# Patient Record
Sex: Female | Born: 1945
Health system: Southern US, Community
[De-identification: ages and names within clinical notes are randomized; demographics above are authoritative.]

---

## 1998-04-14 ENCOUNTER — Other Ambulatory Visit: Admission: RE | Admit: 1998-04-14 | Discharge: 1998-04-14 | Payer: Self-pay | Admitting: *Deleted

## 1999-05-15 ENCOUNTER — Other Ambulatory Visit: Admission: RE | Admit: 1999-05-15 | Discharge: 1999-05-15 | Payer: Self-pay | Admitting: *Deleted

## 1999-11-19 ENCOUNTER — Encounter: Payer: Self-pay | Admitting: Internal Medicine

## 1999-11-19 ENCOUNTER — Encounter: Admission: RE | Admit: 1999-11-19 | Discharge: 1999-11-19 | Payer: Self-pay | Admitting: Internal Medicine

## 2000-06-01 ENCOUNTER — Other Ambulatory Visit: Admission: RE | Admit: 2000-06-01 | Discharge: 2000-06-01 | Payer: Self-pay | Admitting: *Deleted

## 2000-11-22 ENCOUNTER — Encounter: Admission: RE | Admit: 2000-11-22 | Discharge: 2000-11-22 | Payer: Self-pay | Admitting: Internal Medicine

## 2000-11-22 ENCOUNTER — Encounter: Payer: Self-pay | Admitting: Internal Medicine

## 2000-11-25 ENCOUNTER — Encounter: Admission: RE | Admit: 2000-11-25 | Discharge: 2000-11-25 | Payer: Self-pay | Admitting: Internal Medicine

## 2000-11-25 ENCOUNTER — Encounter: Payer: Self-pay | Admitting: Internal Medicine

## 2001-06-05 ENCOUNTER — Other Ambulatory Visit: Admission: RE | Admit: 2001-06-05 | Discharge: 2001-06-05 | Payer: Self-pay | Admitting: *Deleted

## 2001-12-01 ENCOUNTER — Encounter: Payer: Self-pay | Admitting: *Deleted

## 2001-12-01 ENCOUNTER — Encounter: Admission: RE | Admit: 2001-12-01 | Discharge: 2001-12-01 | Payer: Self-pay | Admitting: *Deleted

## 2002-06-11 ENCOUNTER — Other Ambulatory Visit: Admission: RE | Admit: 2002-06-11 | Discharge: 2002-06-11 | Payer: Self-pay | Admitting: Obstetrics and Gynecology

## 2002-07-18 ENCOUNTER — Encounter: Payer: Self-pay | Admitting: Obstetrics and Gynecology

## 2002-07-18 ENCOUNTER — Encounter: Admission: RE | Admit: 2002-07-18 | Discharge: 2002-07-18 | Payer: Self-pay | Admitting: Obstetrics and Gynecology

## 2002-12-04 ENCOUNTER — Encounter: Payer: Self-pay | Admitting: Obstetrics and Gynecology

## 2002-12-04 ENCOUNTER — Encounter: Admission: RE | Admit: 2002-12-04 | Discharge: 2002-12-04 | Payer: Self-pay | Admitting: Obstetrics and Gynecology

## 2003-08-05 ENCOUNTER — Other Ambulatory Visit: Admission: RE | Admit: 2003-08-05 | Discharge: 2003-08-05 | Payer: Self-pay | Admitting: Obstetrics and Gynecology

## 2004-01-30 ENCOUNTER — Encounter: Admission: RE | Admit: 2004-01-30 | Discharge: 2004-01-30 | Payer: Self-pay | Admitting: Obstetrics and Gynecology

## 2004-08-26 ENCOUNTER — Encounter: Admission: RE | Admit: 2004-08-26 | Discharge: 2004-08-26 | Payer: Self-pay | Admitting: Obstetrics and Gynecology

## 2005-02-15 ENCOUNTER — Encounter: Admission: RE | Admit: 2005-02-15 | Discharge: 2005-02-15 | Payer: Self-pay | Admitting: Obstetrics and Gynecology

## 2006-03-10 ENCOUNTER — Encounter: Admission: RE | Admit: 2006-03-10 | Discharge: 2006-03-10 | Payer: Self-pay | Admitting: Obstetrics and Gynecology

## 2007-04-19 ENCOUNTER — Encounter: Admission: RE | Admit: 2007-04-19 | Discharge: 2007-04-19 | Payer: Self-pay | Admitting: Obstetrics and Gynecology

## 2008-04-24 ENCOUNTER — Encounter: Admission: RE | Admit: 2008-04-24 | Discharge: 2008-04-24 | Payer: Self-pay | Admitting: Obstetrics and Gynecology

## 2009-03-25 ENCOUNTER — Ambulatory Visit (HOSPITAL_BASED_OUTPATIENT_CLINIC_OR_DEPARTMENT_OTHER): Admission: RE | Admit: 2009-03-25 | Discharge: 2009-03-25 | Payer: Self-pay | Admitting: Orthopedic Surgery

## 2009-03-25 ENCOUNTER — Encounter (INDEPENDENT_AMBULATORY_CARE_PROVIDER_SITE_OTHER): Payer: Self-pay | Admitting: Orthopedic Surgery

## 2009-04-28 ENCOUNTER — Encounter: Admission: RE | Admit: 2009-04-28 | Discharge: 2009-04-28 | Payer: Self-pay | Admitting: Obstetrics and Gynecology

## 2010-07-09 ENCOUNTER — Encounter: Admission: RE | Admit: 2010-07-09 | Discharge: 2010-07-09 | Payer: Self-pay | Admitting: Obstetrics and Gynecology

## 2011-01-09 ENCOUNTER — Encounter: Payer: Self-pay | Admitting: Internal Medicine

## 2011-02-05 ENCOUNTER — Other Ambulatory Visit: Payer: Self-pay | Admitting: Obstetrics and Gynecology

## 2011-02-05 DIAGNOSIS — M8580 Other specified disorders of bone density and structure, unspecified site: Secondary | ICD-10-CM

## 2011-02-19 ENCOUNTER — Ambulatory Visit
Admission: RE | Admit: 2011-02-19 | Discharge: 2011-02-19 | Disposition: A | Discharge status: Home / Self Care | Payer: Medicare Other | Source: Ambulatory Visit | Attending: Obstetrics and Gynecology | Admitting: Obstetrics and Gynecology

## 2011-02-19 DIAGNOSIS — M8580 Other specified disorders of bone density and structure, unspecified site: Secondary | ICD-10-CM

## 2011-03-31 LAB — POCT HEMOGLOBIN-HEMACUE: Hemoglobin: 14 g/dL (ref 12.0–15.0)

## 2011-05-04 NOTE — Op Note (Signed)
NAME:  Beth Wilcox, Beth Wilcox              ACCOUNT NO.:  [id number]   MEDICAL RECORD NO.:  [id number]          PATIENT TYPE:  AMB   LOCATION:  DSC                          FACILITY:  [facility]   PHYSICIAN:  Katy Fitch. Sypher, M.D. DATE OF BIRTH:  06-Oct-1946   DATE OF PROCEDURE:  03/25/2009  DATE OF DISCHARGE:                               OPERATIVE REPORT   PREOPERATIVE DIAGNOSIS:  Left ulnar neuropathy at wrist level with  clinical examination and MRI documenting a ganglion cyst or tumor  adjacent to the ulnar nerve in the canal of Guyon.   POSTOPERATIVE DIAGNOSIS:  Probable interneural schwannoma.   FINAL DIAGNOSIS:  Pending histopathologic evaluation and biopsy  specimen.   OPERATION:  Decompression of left ulnar nerve at Guyon canal and  resection of an intraneural tumor, presumably schwannoma.   OPERATIONS:  Katy Fitch. Sypher, MD   ASSISTANT:  Marveen Reeks Dasnoit, PA-C   ANESTHESIA:  General by LMA.   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 Kirby Funk  for evaluation and management of weakness and numbness of the left hand.  Clinical examination revealed signs of ulnar nerve dysfunction,  intrinsic atrophy, weak pinch, and discrete loss of sensibility in the  distal ulnar nerve palmar distribution.   Clinical examination revealed a firm area in the hypothenar eminence  suggestive of possible ganglion or other mass.  Beth Wilcox is referred  for an MRI of her hand at [facility] on January 31, 2009.  This was  interpreted by Dr. Vear Clock, revealed probable ganglion in the canal of  Guy[place].  Dr. Vear Clock did not entertain the concept of using contrast  with a second study, however, given the clinical mass predicament we  elected to proceed directly to exploration.   After informed consent, Beth Wilcox is brought to the operating room  at  this time.  Preoperatively, she was advised potential risks and benefits  of surgery.  She understands that we cannot guarantee any particular  degree of nerve recovery after decompression, however, in general once  the nerve was decompressed there is typically recovery of motor and  sensory function over the course of 6 or more months.   Preoperatively, questions were invited and answered in detail.   PROCEDURE:  Beth Wilcox was brought to the operating room and  placed in the supine position upon the operating table.   Following an anesthesia consult with Dr. Katrinka Blazing, general anesthesia by  LMA technique was recommended and accepted.   Beth Wilcox was brought to the operating room and placed in the supine  position upon the operating table and under Dr. Michaelle Copas direct  supervision, general anesthesia by LMA technique was induced.   The left arm was prepped with Betadine soap and solution, sterilely  draped.  A pneumatic tourniquet was applied to the proximal right  brachium.  Following exsanguination of the left arm with Esmarch  bandage, arterial tourniquet was inflated to 230 mmHg.   Procedure commenced with  a longitudinal incision paralleling the ulnar  nerve path through Guyon canal extended proximally with a Brunner zigzag  incision through the wrist flexion creases.  Subcutaneous tissues were  carefully divided revealing the palmaris brevis.  This was released  revealing an obvious intraneural tumor in the ulnar nerve just proximal  to the bifurcation of the motor and sensory branches.   The ulnar nerve and artery were identified proximally and with careful  dissection the epineurium was split.  With the aid of loupe  magnification, we carefully shelled out a highly vascular mass that  would appear to be interfascicular.   With a micro Therapist, nutritional, we were able to very carefully tease the  tumor out of the nerve without disrupting into the axons   This mass  had the characteristics of a schwannoma.   The mass was removed from its pseudocapsule and immediately placed into  formalin for pathologic evaluation.   The nerve was otherwise not manipulated.   The tourniquet was released and bleeding was controlled by direct  pressure.  The wound was then repaired with subcutaneous suture of 4-0  Vicryl and intradermal 3-0 Prolene with Steri-Strips.   A 2% lidocaine was infiltrated for postoperative analgesia.   There were no apparent complications.   Given the intraneural nature of this tumor, we will need to await  pathologic evaluation to determine a prognosis.  The nature of the  lesion will be explained to Mr. And Ms. Gato in the postoperative  conference.      Katy Fitch Sypher, M.D.  Electronically Signed     RVS/MEDQ  D:  03/25/2009  T:  03/25/2009  Job:  [id number]   cc:   Thora Lance, M.D.

## 2011-07-13 ENCOUNTER — Emergency Department (HOSPITAL_COMMUNITY)
Admission: EM | Admit: 2011-07-13 | Discharge: 2011-07-13 | Disposition: A | Discharge status: Home / Self Care | Payer: Medicare Other | Attending: Emergency Medicine | Admitting: Emergency Medicine

## 2011-07-13 DIAGNOSIS — R42 Dizziness and giddiness: Secondary | ICD-10-CM | POA: Insufficient documentation

## 2011-07-13 LAB — CBC
Hemoglobin: 13.4 g/dL (ref 12.0–15.0)
MCHC: 34.1 g/dL (ref 30.0–36.0)
MCV: 91.2 fL (ref 78.0–100.0)

## 2011-07-13 LAB — URINALYSIS, ROUTINE W REFLEX MICROSCOPIC
Hgb urine dipstick: NEGATIVE
Ketones, ur: NEGATIVE mg/dL
Specific Gravity, Urine: 1.025 (ref 1.005–1.030)
pH: 6 (ref 5.0–8.0)

## 2011-07-13 LAB — GLUCOSE, CAPILLARY: Glucose-Capillary: 120 mg/dL — ABNORMAL HIGH (ref 70–99)

## 2011-07-13 LAB — URINE MICROSCOPIC-ADD ON

## 2011-07-13 LAB — DIFFERENTIAL
Basophils Absolute: 0 10*3/uL (ref 0.0–0.1)
Basophils Relative: 0 % (ref 0–1)
Eosinophils Absolute: 0.2 10*3/uL (ref 0.0–0.7)
Eosinophils Relative: 2 % (ref 0–5)
Lymphocytes Relative: 24 % (ref 12–46)
Lymphs Abs: 1.7 10*3/uL (ref 0.7–4.0)
Monocytes Absolute: 0.6 10*3/uL (ref 0.1–1.0)
Monocytes Relative: 9 % (ref 3–12)
Neutro Abs: 4.7 10*3/uL (ref 1.7–7.7)

## 2011-07-13 LAB — POCT I-STAT, CHEM 8: Chloride: 102 mEq/L (ref 96–112)

## 2011-08-24 ENCOUNTER — Other Ambulatory Visit: Payer: Self-pay | Admitting: Obstetrics

## 2011-08-24 DIAGNOSIS — Z1231 Encounter for screening mammogram for malignant neoplasm of breast: Secondary | ICD-10-CM

## 2011-09-02 ENCOUNTER — Ambulatory Visit
Admission: RE | Admit: 2011-09-02 | Discharge: 2011-09-02 | Disposition: A | Discharge status: Home / Self Care | Payer: Medicare Other | Source: Ambulatory Visit | Attending: Obstetrics | Admitting: Obstetrics

## 2011-09-02 DIAGNOSIS — Z1231 Encounter for screening mammogram for malignant neoplasm of breast: Secondary | ICD-10-CM

## 2011-11-25 ENCOUNTER — Encounter (HOSPITAL_COMMUNITY): Admission: RE | Payer: Self-pay | Source: Ambulatory Visit

## 2011-11-25 ENCOUNTER — Ambulatory Visit (HOSPITAL_COMMUNITY): Admission: RE | Admit: 2011-11-25 | Payer: Medicare Other | Source: Ambulatory Visit | Admitting: Gastroenterology

## 2011-11-25 SURGERY — COLONOSCOPY
Anesthesia: Moderate Sedation

## 2012-02-25 DIAGNOSIS — Z136 Encounter for screening for cardiovascular disorders: Secondary | ICD-10-CM | POA: Diagnosis not present

## 2012-02-25 DIAGNOSIS — Z0000 Encounter for general adult medical examination without abnormal findings: Secondary | ICD-10-CM | POA: Diagnosis not present

## 2012-05-29 DIAGNOSIS — Z01419 Encounter for gynecological examination (general) (routine) without abnormal findings: Secondary | ICD-10-CM | POA: Diagnosis not present

## 2012-05-29 DIAGNOSIS — Z124 Encounter for screening for malignant neoplasm of cervix: Secondary | ICD-10-CM | POA: Diagnosis not present

## 2012-08-25 ENCOUNTER — Other Ambulatory Visit: Payer: Self-pay | Admitting: Dermatology

## 2012-08-25 DIAGNOSIS — L570 Actinic keratosis: Secondary | ICD-10-CM | POA: Diagnosis not present

## 2012-08-25 DIAGNOSIS — L819 Disorder of pigmentation, unspecified: Secondary | ICD-10-CM | POA: Diagnosis not present

## 2012-08-25 DIAGNOSIS — D485 Neoplasm of uncertain behavior of skin: Secondary | ICD-10-CM | POA: Diagnosis not present

## 2012-08-25 DIAGNOSIS — D239 Other benign neoplasm of skin, unspecified: Secondary | ICD-10-CM | POA: Diagnosis not present

## 2012-09-12 ENCOUNTER — Other Ambulatory Visit: Payer: Self-pay | Admitting: Obstetrics

## 2012-09-12 DIAGNOSIS — Z1231 Encounter for screening mammogram for malignant neoplasm of breast: Secondary | ICD-10-CM

## 2012-09-25 DIAGNOSIS — Z961 Presence of intraocular lens: Secondary | ICD-10-CM | POA: Diagnosis not present

## 2012-10-05 ENCOUNTER — Ambulatory Visit
Admission: RE | Admit: 2012-10-05 | Discharge: 2012-10-05 | Disposition: A | Discharge status: Home / Self Care | Payer: Medicare Other | Source: Ambulatory Visit | Attending: Obstetrics | Admitting: Obstetrics

## 2012-10-05 DIAGNOSIS — Z1231 Encounter for screening mammogram for malignant neoplasm of breast: Secondary | ICD-10-CM | POA: Diagnosis not present

## 2012-10-09 DIAGNOSIS — Z23 Encounter for immunization: Secondary | ICD-10-CM | POA: Diagnosis not present

## 2012-11-23 DIAGNOSIS — M999 Biomechanical lesion, unspecified: Secondary | ICD-10-CM | POA: Diagnosis not present

## 2012-11-23 DIAGNOSIS — M5430 Sciatica, unspecified side: Secondary | ICD-10-CM | POA: Diagnosis not present

## 2012-11-23 DIAGNOSIS — M5030 Other cervical disc degeneration, unspecified cervical region: Secondary | ICD-10-CM | POA: Diagnosis not present

## 2012-11-23 DIAGNOSIS — IMO0002 Reserved for concepts with insufficient information to code with codable children: Secondary | ICD-10-CM | POA: Diagnosis not present

## 2012-11-23 DIAGNOSIS — M9981 Other biomechanical lesions of cervical region: Secondary | ICD-10-CM | POA: Diagnosis not present

## 2012-11-27 DIAGNOSIS — M5430 Sciatica, unspecified side: Secondary | ICD-10-CM | POA: Diagnosis not present

## 2012-11-27 DIAGNOSIS — M999 Biomechanical lesion, unspecified: Secondary | ICD-10-CM | POA: Diagnosis not present

## 2012-11-27 DIAGNOSIS — M9981 Other biomechanical lesions of cervical region: Secondary | ICD-10-CM | POA: Diagnosis not present

## 2012-11-27 DIAGNOSIS — M5030 Other cervical disc degeneration, unspecified cervical region: Secondary | ICD-10-CM | POA: Diagnosis not present

## 2012-11-27 DIAGNOSIS — IMO0002 Reserved for concepts with insufficient information to code with codable children: Secondary | ICD-10-CM | POA: Diagnosis not present

## 2012-11-29 DIAGNOSIS — M5430 Sciatica, unspecified side: Secondary | ICD-10-CM | POA: Diagnosis not present

## 2012-11-29 DIAGNOSIS — IMO0002 Reserved for concepts with insufficient information to code with codable children: Secondary | ICD-10-CM | POA: Diagnosis not present

## 2012-11-29 DIAGNOSIS — M5030 Other cervical disc degeneration, unspecified cervical region: Secondary | ICD-10-CM | POA: Diagnosis not present

## 2012-11-29 DIAGNOSIS — M999 Biomechanical lesion, unspecified: Secondary | ICD-10-CM | POA: Diagnosis not present

## 2012-11-29 DIAGNOSIS — M9981 Other biomechanical lesions of cervical region: Secondary | ICD-10-CM | POA: Diagnosis not present

## 2012-12-01 DIAGNOSIS — M5030 Other cervical disc degeneration, unspecified cervical region: Secondary | ICD-10-CM | POA: Diagnosis not present

## 2012-12-01 DIAGNOSIS — M999 Biomechanical lesion, unspecified: Secondary | ICD-10-CM | POA: Diagnosis not present

## 2012-12-01 DIAGNOSIS — IMO0002 Reserved for concepts with insufficient information to code with codable children: Secondary | ICD-10-CM | POA: Diagnosis not present

## 2012-12-01 DIAGNOSIS — M5430 Sciatica, unspecified side: Secondary | ICD-10-CM | POA: Diagnosis not present

## 2012-12-01 DIAGNOSIS — M9981 Other biomechanical lesions of cervical region: Secondary | ICD-10-CM | POA: Diagnosis not present

## 2012-12-04 DIAGNOSIS — M999 Biomechanical lesion, unspecified: Secondary | ICD-10-CM | POA: Diagnosis not present

## 2012-12-04 DIAGNOSIS — M9981 Other biomechanical lesions of cervical region: Secondary | ICD-10-CM | POA: Diagnosis not present

## 2012-12-04 DIAGNOSIS — IMO0002 Reserved for concepts with insufficient information to code with codable children: Secondary | ICD-10-CM | POA: Diagnosis not present

## 2012-12-04 DIAGNOSIS — M5430 Sciatica, unspecified side: Secondary | ICD-10-CM | POA: Diagnosis not present

## 2012-12-04 DIAGNOSIS — M5030 Other cervical disc degeneration, unspecified cervical region: Secondary | ICD-10-CM | POA: Diagnosis not present

## 2012-12-06 DIAGNOSIS — M9981 Other biomechanical lesions of cervical region: Secondary | ICD-10-CM | POA: Diagnosis not present

## 2012-12-06 DIAGNOSIS — IMO0002 Reserved for concepts with insufficient information to code with codable children: Secondary | ICD-10-CM | POA: Diagnosis not present

## 2012-12-06 DIAGNOSIS — M999 Biomechanical lesion, unspecified: Secondary | ICD-10-CM | POA: Diagnosis not present

## 2012-12-06 DIAGNOSIS — M5030 Other cervical disc degeneration, unspecified cervical region: Secondary | ICD-10-CM | POA: Diagnosis not present

## 2012-12-06 DIAGNOSIS — M5430 Sciatica, unspecified side: Secondary | ICD-10-CM | POA: Diagnosis not present

## 2012-12-08 DIAGNOSIS — M999 Biomechanical lesion, unspecified: Secondary | ICD-10-CM | POA: Diagnosis not present

## 2012-12-08 DIAGNOSIS — M9981 Other biomechanical lesions of cervical region: Secondary | ICD-10-CM | POA: Diagnosis not present

## 2012-12-08 DIAGNOSIS — IMO0002 Reserved for concepts with insufficient information to code with codable children: Secondary | ICD-10-CM | POA: Diagnosis not present

## 2012-12-08 DIAGNOSIS — M5430 Sciatica, unspecified side: Secondary | ICD-10-CM | POA: Diagnosis not present

## 2012-12-08 DIAGNOSIS — M5030 Other cervical disc degeneration, unspecified cervical region: Secondary | ICD-10-CM | POA: Diagnosis not present

## 2012-12-18 DIAGNOSIS — M5430 Sciatica, unspecified side: Secondary | ICD-10-CM | POA: Diagnosis not present

## 2012-12-18 DIAGNOSIS — M999 Biomechanical lesion, unspecified: Secondary | ICD-10-CM | POA: Diagnosis not present

## 2012-12-18 DIAGNOSIS — M9981 Other biomechanical lesions of cervical region: Secondary | ICD-10-CM | POA: Diagnosis not present

## 2012-12-18 DIAGNOSIS — M5030 Other cervical disc degeneration, unspecified cervical region: Secondary | ICD-10-CM | POA: Diagnosis not present

## 2012-12-18 DIAGNOSIS — IMO0002 Reserved for concepts with insufficient information to code with codable children: Secondary | ICD-10-CM | POA: Diagnosis not present

## 2012-12-21 DIAGNOSIS — IMO0002 Reserved for concepts with insufficient information to code with codable children: Secondary | ICD-10-CM | POA: Diagnosis not present

## 2012-12-21 DIAGNOSIS — M999 Biomechanical lesion, unspecified: Secondary | ICD-10-CM | POA: Diagnosis not present

## 2012-12-21 DIAGNOSIS — M5030 Other cervical disc degeneration, unspecified cervical region: Secondary | ICD-10-CM | POA: Diagnosis not present

## 2012-12-21 DIAGNOSIS — M5430 Sciatica, unspecified side: Secondary | ICD-10-CM | POA: Diagnosis not present

## 2012-12-21 DIAGNOSIS — M9981 Other biomechanical lesions of cervical region: Secondary | ICD-10-CM | POA: Diagnosis not present

## 2012-12-25 DIAGNOSIS — IMO0002 Reserved for concepts with insufficient information to code with codable children: Secondary | ICD-10-CM | POA: Diagnosis not present

## 2012-12-25 DIAGNOSIS — M5030 Other cervical disc degeneration, unspecified cervical region: Secondary | ICD-10-CM | POA: Diagnosis not present

## 2012-12-25 DIAGNOSIS — M5430 Sciatica, unspecified side: Secondary | ICD-10-CM | POA: Diagnosis not present

## 2012-12-25 DIAGNOSIS — M999 Biomechanical lesion, unspecified: Secondary | ICD-10-CM | POA: Diagnosis not present

## 2012-12-25 DIAGNOSIS — M9981 Other biomechanical lesions of cervical region: Secondary | ICD-10-CM | POA: Diagnosis not present

## 2012-12-27 DIAGNOSIS — M999 Biomechanical lesion, unspecified: Secondary | ICD-10-CM | POA: Diagnosis not present

## 2012-12-27 DIAGNOSIS — M9981 Other biomechanical lesions of cervical region: Secondary | ICD-10-CM | POA: Diagnosis not present

## 2012-12-27 DIAGNOSIS — M5030 Other cervical disc degeneration, unspecified cervical region: Secondary | ICD-10-CM | POA: Diagnosis not present

## 2012-12-27 DIAGNOSIS — M5430 Sciatica, unspecified side: Secondary | ICD-10-CM | POA: Diagnosis not present

## 2012-12-27 DIAGNOSIS — IMO0002 Reserved for concepts with insufficient information to code with codable children: Secondary | ICD-10-CM | POA: Diagnosis not present

## 2013-01-02 DIAGNOSIS — N3000 Acute cystitis without hematuria: Secondary | ICD-10-CM | POA: Diagnosis not present

## 2013-01-02 DIAGNOSIS — R300 Dysuria: Secondary | ICD-10-CM | POA: Diagnosis not present

## 2013-01-03 DIAGNOSIS — M9981 Other biomechanical lesions of cervical region: Secondary | ICD-10-CM | POA: Diagnosis not present

## 2013-01-03 DIAGNOSIS — M5430 Sciatica, unspecified side: Secondary | ICD-10-CM | POA: Diagnosis not present

## 2013-01-03 DIAGNOSIS — M999 Biomechanical lesion, unspecified: Secondary | ICD-10-CM | POA: Diagnosis not present

## 2013-01-03 DIAGNOSIS — M5030 Other cervical disc degeneration, unspecified cervical region: Secondary | ICD-10-CM | POA: Diagnosis not present

## 2013-01-03 DIAGNOSIS — IMO0002 Reserved for concepts with insufficient information to code with codable children: Secondary | ICD-10-CM | POA: Diagnosis not present

## 2013-01-19 DIAGNOSIS — M999 Biomechanical lesion, unspecified: Secondary | ICD-10-CM | POA: Diagnosis not present

## 2013-01-19 DIAGNOSIS — IMO0002 Reserved for concepts with insufficient information to code with codable children: Secondary | ICD-10-CM | POA: Diagnosis not present

## 2013-01-19 DIAGNOSIS — M5430 Sciatica, unspecified side: Secondary | ICD-10-CM | POA: Diagnosis not present

## 2013-01-19 DIAGNOSIS — M5030 Other cervical disc degeneration, unspecified cervical region: Secondary | ICD-10-CM | POA: Diagnosis not present

## 2013-01-19 DIAGNOSIS — M9981 Other biomechanical lesions of cervical region: Secondary | ICD-10-CM | POA: Diagnosis not present

## 2013-02-08 DIAGNOSIS — M5430 Sciatica, unspecified side: Secondary | ICD-10-CM | POA: Diagnosis not present

## 2013-02-08 DIAGNOSIS — IMO0002 Reserved for concepts with insufficient information to code with codable children: Secondary | ICD-10-CM | POA: Diagnosis not present

## 2013-02-08 DIAGNOSIS — M5030 Other cervical disc degeneration, unspecified cervical region: Secondary | ICD-10-CM | POA: Diagnosis not present

## 2013-02-08 DIAGNOSIS — M9981 Other biomechanical lesions of cervical region: Secondary | ICD-10-CM | POA: Diagnosis not present

## 2013-02-08 DIAGNOSIS — M999 Biomechanical lesion, unspecified: Secondary | ICD-10-CM | POA: Diagnosis not present

## 2013-02-27 DIAGNOSIS — M25569 Pain in unspecified knee: Secondary | ICD-10-CM | POA: Diagnosis not present

## 2013-02-27 DIAGNOSIS — Z1331 Encounter for screening for depression: Secondary | ICD-10-CM | POA: Diagnosis not present

## 2013-03-01 DIAGNOSIS — M9981 Other biomechanical lesions of cervical region: Secondary | ICD-10-CM | POA: Diagnosis not present

## 2013-03-01 DIAGNOSIS — M999 Biomechanical lesion, unspecified: Secondary | ICD-10-CM | POA: Diagnosis not present

## 2013-03-01 DIAGNOSIS — M5030 Other cervical disc degeneration, unspecified cervical region: Secondary | ICD-10-CM | POA: Diagnosis not present

## 2013-03-01 DIAGNOSIS — IMO0002 Reserved for concepts with insufficient information to code with codable children: Secondary | ICD-10-CM | POA: Diagnosis not present

## 2013-03-01 DIAGNOSIS — M5430 Sciatica, unspecified side: Secondary | ICD-10-CM | POA: Diagnosis not present

## 2013-03-29 DIAGNOSIS — M5430 Sciatica, unspecified side: Secondary | ICD-10-CM | POA: Diagnosis not present

## 2013-03-29 DIAGNOSIS — IMO0002 Reserved for concepts with insufficient information to code with codable children: Secondary | ICD-10-CM | POA: Diagnosis not present

## 2013-03-29 DIAGNOSIS — M9981 Other biomechanical lesions of cervical region: Secondary | ICD-10-CM | POA: Diagnosis not present

## 2013-03-29 DIAGNOSIS — M5030 Other cervical disc degeneration, unspecified cervical region: Secondary | ICD-10-CM | POA: Diagnosis not present

## 2013-03-29 DIAGNOSIS — M999 Biomechanical lesion, unspecified: Secondary | ICD-10-CM | POA: Diagnosis not present

## 2013-04-27 DIAGNOSIS — M9981 Other biomechanical lesions of cervical region: Secondary | ICD-10-CM | POA: Diagnosis not present

## 2013-04-27 DIAGNOSIS — M999 Biomechanical lesion, unspecified: Secondary | ICD-10-CM | POA: Diagnosis not present

## 2013-04-27 DIAGNOSIS — M5430 Sciatica, unspecified side: Secondary | ICD-10-CM | POA: Diagnosis not present

## 2013-04-27 DIAGNOSIS — IMO0002 Reserved for concepts with insufficient information to code with codable children: Secondary | ICD-10-CM | POA: Diagnosis not present

## 2013-04-27 DIAGNOSIS — M5030 Other cervical disc degeneration, unspecified cervical region: Secondary | ICD-10-CM | POA: Diagnosis not present

## 2013-05-25 DIAGNOSIS — IMO0002 Reserved for concepts with insufficient information to code with codable children: Secondary | ICD-10-CM | POA: Diagnosis not present

## 2013-05-25 DIAGNOSIS — M5030 Other cervical disc degeneration, unspecified cervical region: Secondary | ICD-10-CM | POA: Diagnosis not present

## 2013-05-25 DIAGNOSIS — M9981 Other biomechanical lesions of cervical region: Secondary | ICD-10-CM | POA: Diagnosis not present

## 2013-05-25 DIAGNOSIS — M5430 Sciatica, unspecified side: Secondary | ICD-10-CM | POA: Diagnosis not present

## 2013-05-25 DIAGNOSIS — M999 Biomechanical lesion, unspecified: Secondary | ICD-10-CM | POA: Diagnosis not present

## 2013-06-13 DIAGNOSIS — L819 Disorder of pigmentation, unspecified: Secondary | ICD-10-CM | POA: Diagnosis not present

## 2013-06-13 DIAGNOSIS — D1801 Hemangioma of skin and subcutaneous tissue: Secondary | ICD-10-CM | POA: Diagnosis not present

## 2013-06-13 DIAGNOSIS — L570 Actinic keratosis: Secondary | ICD-10-CM | POA: Diagnosis not present

## 2013-06-18 DIAGNOSIS — Z124 Encounter for screening for malignant neoplasm of cervix: Secondary | ICD-10-CM | POA: Diagnosis not present

## 2013-06-21 ENCOUNTER — Other Ambulatory Visit: Payer: Self-pay | Admitting: Obstetrics

## 2013-06-21 DIAGNOSIS — M8580 Other specified disorders of bone density and structure, unspecified site: Secondary | ICD-10-CM

## 2013-07-02 DIAGNOSIS — M999 Biomechanical lesion, unspecified: Secondary | ICD-10-CM | POA: Diagnosis not present

## 2013-07-02 DIAGNOSIS — M5030 Other cervical disc degeneration, unspecified cervical region: Secondary | ICD-10-CM | POA: Diagnosis not present

## 2013-07-02 DIAGNOSIS — M5430 Sciatica, unspecified side: Secondary | ICD-10-CM | POA: Diagnosis not present

## 2013-07-02 DIAGNOSIS — IMO0002 Reserved for concepts with insufficient information to code with codable children: Secondary | ICD-10-CM | POA: Diagnosis not present

## 2013-07-02 DIAGNOSIS — M9981 Other biomechanical lesions of cervical region: Secondary | ICD-10-CM | POA: Diagnosis not present

## 2013-07-03 ENCOUNTER — Other Ambulatory Visit: Payer: Medicare Other

## 2013-07-10 ENCOUNTER — Ambulatory Visit
Admission: RE | Admit: 2013-07-10 | Discharge: 2013-07-10 | Disposition: A | Discharge status: Home / Self Care | Payer: Medicare Other | Source: Ambulatory Visit | Attending: Obstetrics | Admitting: Obstetrics

## 2013-07-10 DIAGNOSIS — M949 Disorder of cartilage, unspecified: Secondary | ICD-10-CM | POA: Diagnosis not present

## 2013-07-10 DIAGNOSIS — M899 Disorder of bone, unspecified: Secondary | ICD-10-CM | POA: Diagnosis not present

## 2013-07-10 DIAGNOSIS — M8580 Other specified disorders of bone density and structure, unspecified site: Secondary | ICD-10-CM

## 2013-08-14 DIAGNOSIS — M899 Disorder of bone, unspecified: Secondary | ICD-10-CM | POA: Diagnosis not present

## 2013-08-16 DIAGNOSIS — M542 Cervicalgia: Secondary | ICD-10-CM | POA: Diagnosis not present

## 2013-08-24 DIAGNOSIS — M542 Cervicalgia: Secondary | ICD-10-CM | POA: Diagnosis not present

## 2013-08-27 DIAGNOSIS — M542 Cervicalgia: Secondary | ICD-10-CM | POA: Diagnosis not present

## 2013-09-07 DIAGNOSIS — M542 Cervicalgia: Secondary | ICD-10-CM | POA: Diagnosis not present

## 2013-09-12 DIAGNOSIS — M542 Cervicalgia: Secondary | ICD-10-CM | POA: Diagnosis not present

## 2013-09-17 DIAGNOSIS — Z23 Encounter for immunization: Secondary | ICD-10-CM | POA: Diagnosis not present

## 2013-09-18 DIAGNOSIS — M542 Cervicalgia: Secondary | ICD-10-CM | POA: Diagnosis not present

## 2013-09-20 DIAGNOSIS — M542 Cervicalgia: Secondary | ICD-10-CM | POA: Diagnosis not present

## 2013-09-21 ENCOUNTER — Other Ambulatory Visit: Payer: Self-pay

## 2013-09-21 DIAGNOSIS — Z1231 Encounter for screening mammogram for malignant neoplasm of breast: Secondary | ICD-10-CM

## 2013-09-26 DIAGNOSIS — M542 Cervicalgia: Secondary | ICD-10-CM | POA: Diagnosis not present

## 2013-10-09 ENCOUNTER — Ambulatory Visit
Admission: RE | Admit: 2013-10-09 | Discharge: 2013-10-09 | Disposition: A | Discharge status: Home / Self Care | Payer: Medicare Other | Source: Ambulatory Visit

## 2013-10-09 DIAGNOSIS — Z1231 Encounter for screening mammogram for malignant neoplasm of breast: Secondary | ICD-10-CM | POA: Diagnosis not present

## 2013-12-06 DIAGNOSIS — Z961 Presence of intraocular lens: Secondary | ICD-10-CM | POA: Diagnosis not present

## 2014-02-13 DIAGNOSIS — M542 Cervicalgia: Secondary | ICD-10-CM | POA: Diagnosis not present

## 2014-03-07 DIAGNOSIS — Z0000 Encounter for general adult medical examination without abnormal findings: Secondary | ICD-10-CM | POA: Diagnosis not present

## 2014-03-07 DIAGNOSIS — Z1331 Encounter for screening for depression: Secondary | ICD-10-CM | POA: Diagnosis not present

## 2014-03-07 DIAGNOSIS — Z136 Encounter for screening for cardiovascular disorders: Secondary | ICD-10-CM | POA: Diagnosis not present

## 2014-03-07 DIAGNOSIS — Z23 Encounter for immunization: Secondary | ICD-10-CM | POA: Diagnosis not present

## 2014-05-30 ENCOUNTER — Other Ambulatory Visit: Payer: Self-pay | Admitting: Dermatology

## 2014-05-30 DIAGNOSIS — L820 Inflamed seborrheic keratosis: Secondary | ICD-10-CM | POA: Diagnosis not present

## 2014-05-30 DIAGNOSIS — L570 Actinic keratosis: Secondary | ICD-10-CM | POA: Diagnosis not present

## 2014-05-30 DIAGNOSIS — D485 Neoplasm of uncertain behavior of skin: Secondary | ICD-10-CM | POA: Diagnosis not present

## 2014-05-30 DIAGNOSIS — L819 Disorder of pigmentation, unspecified: Secondary | ICD-10-CM | POA: Diagnosis not present

## 2014-08-21 DIAGNOSIS — M542 Cervicalgia: Secondary | ICD-10-CM | POA: Diagnosis not present

## 2014-08-28 DIAGNOSIS — M542 Cervicalgia: Secondary | ICD-10-CM | POA: Diagnosis not present

## 2014-08-30 DIAGNOSIS — M542 Cervicalgia: Secondary | ICD-10-CM | POA: Diagnosis not present

## 2014-09-02 DIAGNOSIS — M542 Cervicalgia: Secondary | ICD-10-CM | POA: Diagnosis not present

## 2014-09-09 DIAGNOSIS — M542 Cervicalgia: Secondary | ICD-10-CM | POA: Diagnosis not present

## 2014-09-10 ENCOUNTER — Other Ambulatory Visit: Payer: Self-pay

## 2014-09-10 DIAGNOSIS — Z1231 Encounter for screening mammogram for malignant neoplasm of breast: Secondary | ICD-10-CM

## 2014-09-12 DIAGNOSIS — M542 Cervicalgia: Secondary | ICD-10-CM | POA: Diagnosis not present

## 2014-09-17 DIAGNOSIS — M542 Cervicalgia: Secondary | ICD-10-CM | POA: Diagnosis not present

## 2014-09-19 DIAGNOSIS — Z23 Encounter for immunization: Secondary | ICD-10-CM | POA: Diagnosis not present

## 2014-10-17 ENCOUNTER — Ambulatory Visit
Admission: RE | Admit: 2014-10-17 | Discharge: 2014-10-17 | Disposition: A | Discharge status: Home / Self Care | Payer: Medicare Other | Source: Ambulatory Visit

## 2014-10-17 DIAGNOSIS — Z1231 Encounter for screening mammogram for malignant neoplasm of breast: Secondary | ICD-10-CM | POA: Diagnosis not present

## 2014-12-18 DIAGNOSIS — Z961 Presence of intraocular lens: Secondary | ICD-10-CM | POA: Diagnosis not present

## 2015-03-24 DIAGNOSIS — Z6831 Body mass index (BMI) 31.0-31.9, adult: Secondary | ICD-10-CM | POA: Diagnosis not present

## 2015-03-24 DIAGNOSIS — E6609 Other obesity due to excess calories: Secondary | ICD-10-CM | POA: Diagnosis not present

## 2015-03-24 DIAGNOSIS — Z1389 Encounter for screening for other disorder: Secondary | ICD-10-CM | POA: Diagnosis not present

## 2015-03-24 DIAGNOSIS — Z0000 Encounter for general adult medical examination without abnormal findings: Secondary | ICD-10-CM | POA: Diagnosis not present

## 2015-03-24 DIAGNOSIS — Z136 Encounter for screening for cardiovascular disorders: Secondary | ICD-10-CM | POA: Diagnosis not present

## 2015-03-27 DIAGNOSIS — L814 Other melanin hyperpigmentation: Secondary | ICD-10-CM | POA: Diagnosis not present

## 2015-03-27 DIAGNOSIS — D2261 Melanocytic nevi of right upper limb, including shoulder: Secondary | ICD-10-CM | POA: Diagnosis not present

## 2015-03-27 DIAGNOSIS — L821 Other seborrheic keratosis: Secondary | ICD-10-CM | POA: Diagnosis not present

## 2015-03-27 DIAGNOSIS — D1801 Hemangioma of skin and subcutaneous tissue: Secondary | ICD-10-CM | POA: Diagnosis not present

## 2015-03-29 DIAGNOSIS — H6981 Other specified disorders of Eustachian tube, right ear: Secondary | ICD-10-CM | POA: Diagnosis not present

## 2015-03-29 DIAGNOSIS — H9201 Otalgia, right ear: Secondary | ICD-10-CM | POA: Diagnosis not present

## 2015-03-29 DIAGNOSIS — J069 Acute upper respiratory infection, unspecified: Secondary | ICD-10-CM | POA: Diagnosis not present

## 2015-04-28 DIAGNOSIS — H6983 Other specified disorders of Eustachian tube, bilateral: Secondary | ICD-10-CM | POA: Diagnosis not present

## 2015-04-28 DIAGNOSIS — H9193 Unspecified hearing loss, bilateral: Secondary | ICD-10-CM | POA: Diagnosis not present

## 2015-04-28 DIAGNOSIS — J309 Allergic rhinitis, unspecified: Secondary | ICD-10-CM | POA: Diagnosis not present

## 2015-08-04 DIAGNOSIS — L259 Unspecified contact dermatitis, unspecified cause: Secondary | ICD-10-CM | POA: Diagnosis not present

## 2015-09-22 DIAGNOSIS — J309 Allergic rhinitis, unspecified: Secondary | ICD-10-CM | POA: Diagnosis not present

## 2015-09-22 DIAGNOSIS — J300 Vasomotor rhinitis: Secondary | ICD-10-CM | POA: Diagnosis not present

## 2015-09-22 DIAGNOSIS — H1045 Other chronic allergic conjunctivitis: Secondary | ICD-10-CM | POA: Diagnosis not present

## 2015-09-23 DIAGNOSIS — Z23 Encounter for immunization: Secondary | ICD-10-CM | POA: Diagnosis not present

## 2015-10-10 ENCOUNTER — Other Ambulatory Visit: Payer: Self-pay

## 2015-10-10 DIAGNOSIS — Z1231 Encounter for screening mammogram for malignant neoplasm of breast: Secondary | ICD-10-CM

## 2015-10-22 ENCOUNTER — Ambulatory Visit
Admission: RE | Admit: 2015-10-22 | Discharge: 2015-10-22 | Disposition: A | Discharge status: Home / Self Care | Payer: Medicare Other | Source: Ambulatory Visit

## 2015-10-22 DIAGNOSIS — Z1231 Encounter for screening mammogram for malignant neoplasm of breast: Secondary | ICD-10-CM

## 2016-01-15 DIAGNOSIS — Z961 Presence of intraocular lens: Secondary | ICD-10-CM | POA: Diagnosis not present

## 2016-03-22 DIAGNOSIS — H1045 Other chronic allergic conjunctivitis: Secondary | ICD-10-CM | POA: Diagnosis not present

## 2016-03-22 DIAGNOSIS — J300 Vasomotor rhinitis: Secondary | ICD-10-CM | POA: Diagnosis not present

## 2016-03-23 DIAGNOSIS — M19049 Primary osteoarthritis, unspecified hand: Secondary | ICD-10-CM | POA: Diagnosis not present

## 2016-03-23 DIAGNOSIS — Z1389 Encounter for screening for other disorder: Secondary | ICD-10-CM | POA: Diagnosis not present

## 2016-03-23 DIAGNOSIS — E7800 Pure hypercholesterolemia, unspecified: Secondary | ICD-10-CM | POA: Diagnosis not present

## 2016-03-23 DIAGNOSIS — H9193 Unspecified hearing loss, bilateral: Secondary | ICD-10-CM | POA: Diagnosis not present

## 2016-03-23 DIAGNOSIS — J309 Allergic rhinitis, unspecified: Secondary | ICD-10-CM | POA: Diagnosis not present

## 2016-03-23 DIAGNOSIS — J300 Vasomotor rhinitis: Secondary | ICD-10-CM | POA: Diagnosis not present

## 2016-04-13 DIAGNOSIS — H9313 Tinnitus, bilateral: Secondary | ICD-10-CM | POA: Diagnosis not present

## 2016-04-13 DIAGNOSIS — H903 Sensorineural hearing loss, bilateral: Secondary | ICD-10-CM | POA: Diagnosis not present

## 2016-06-02 DIAGNOSIS — L570 Actinic keratosis: Secondary | ICD-10-CM | POA: Diagnosis not present

## 2016-06-02 DIAGNOSIS — L821 Other seborrheic keratosis: Secondary | ICD-10-CM | POA: Diagnosis not present

## 2016-06-02 DIAGNOSIS — L814 Other melanin hyperpigmentation: Secondary | ICD-10-CM | POA: Diagnosis not present

## 2016-06-02 DIAGNOSIS — D1801 Hemangioma of skin and subcutaneous tissue: Secondary | ICD-10-CM | POA: Diagnosis not present

## 2016-09-20 ENCOUNTER — Other Ambulatory Visit: Payer: Self-pay | Admitting: Internal Medicine

## 2016-09-20 DIAGNOSIS — Z1231 Encounter for screening mammogram for malignant neoplasm of breast: Secondary | ICD-10-CM

## 2016-09-24 DIAGNOSIS — Z23 Encounter for immunization: Secondary | ICD-10-CM | POA: Diagnosis not present

## 2016-10-25 ENCOUNTER — Ambulatory Visit
Admission: RE | Admit: 2016-10-25 | Discharge: 2016-10-25 | Disposition: A | Discharge status: Home / Self Care | Payer: Medicare Other | Source: Ambulatory Visit | Attending: Internal Medicine | Admitting: Internal Medicine

## 2016-10-25 DIAGNOSIS — Z1231 Encounter for screening mammogram for malignant neoplasm of breast: Secondary | ICD-10-CM | POA: Diagnosis not present

## 2017-03-25 DIAGNOSIS — Z0000 Encounter for general adult medical examination without abnormal findings: Secondary | ICD-10-CM | POA: Diagnosis not present

## 2017-03-25 DIAGNOSIS — E7800 Pure hypercholesterolemia, unspecified: Secondary | ICD-10-CM | POA: Diagnosis not present

## 2017-03-25 DIAGNOSIS — Z1389 Encounter for screening for other disorder: Secondary | ICD-10-CM | POA: Diagnosis not present

## 2017-03-25 DIAGNOSIS — K219 Gastro-esophageal reflux disease without esophagitis: Secondary | ICD-10-CM | POA: Diagnosis not present

## 2017-03-29 DIAGNOSIS — Z124 Encounter for screening for malignant neoplasm of cervix: Secondary | ICD-10-CM | POA: Diagnosis not present

## 2017-03-29 DIAGNOSIS — N644 Mastodynia: Secondary | ICD-10-CM | POA: Diagnosis not present

## 2017-03-29 DIAGNOSIS — Z01419 Encounter for gynecological examination (general) (routine) without abnormal findings: Secondary | ICD-10-CM | POA: Diagnosis not present

## 2017-07-04 DIAGNOSIS — M79672 Pain in left foot: Secondary | ICD-10-CM | POA: Diagnosis not present

## 2017-07-19 DIAGNOSIS — H524 Presbyopia: Secondary | ICD-10-CM | POA: Diagnosis not present

## 2017-07-19 DIAGNOSIS — H5203 Hypermetropia, bilateral: Secondary | ICD-10-CM | POA: Diagnosis not present

## 2017-07-19 DIAGNOSIS — H52203 Unspecified astigmatism, bilateral: Secondary | ICD-10-CM | POA: Diagnosis not present

## 2017-07-19 DIAGNOSIS — Z961 Presence of intraocular lens: Secondary | ICD-10-CM | POA: Diagnosis not present

## 2017-08-24 DIAGNOSIS — D1801 Hemangioma of skin and subcutaneous tissue: Secondary | ICD-10-CM | POA: Diagnosis not present

## 2017-08-24 DIAGNOSIS — L570 Actinic keratosis: Secondary | ICD-10-CM | POA: Diagnosis not present

## 2017-08-24 DIAGNOSIS — L821 Other seborrheic keratosis: Secondary | ICD-10-CM | POA: Diagnosis not present

## 2017-08-24 DIAGNOSIS — L814 Other melanin hyperpigmentation: Secondary | ICD-10-CM | POA: Diagnosis not present

## 2017-08-24 DIAGNOSIS — L720 Epidermal cyst: Secondary | ICD-10-CM | POA: Diagnosis not present

## 2017-08-24 DIAGNOSIS — D485 Neoplasm of uncertain behavior of skin: Secondary | ICD-10-CM | POA: Diagnosis not present

## 2017-08-24 DIAGNOSIS — L738 Other specified follicular disorders: Secondary | ICD-10-CM | POA: Diagnosis not present

## 2017-10-07 DIAGNOSIS — Z23 Encounter for immunization: Secondary | ICD-10-CM | POA: Diagnosis not present

## 2017-12-21 ENCOUNTER — Other Ambulatory Visit: Payer: Self-pay | Admitting: Internal Medicine

## 2017-12-21 DIAGNOSIS — Z1231 Encounter for screening mammogram for malignant neoplasm of breast: Secondary | ICD-10-CM

## 2018-01-10 ENCOUNTER — Ambulatory Visit: Payer: Medicare Other

## 2018-01-13 ENCOUNTER — Ambulatory Visit
Admission: RE | Admit: 2018-01-13 | Discharge: 2018-01-13 | Disposition: A | Discharge status: Home / Self Care | Payer: Medicare Other | Source: Ambulatory Visit | Attending: Internal Medicine | Admitting: Internal Medicine

## 2018-01-13 DIAGNOSIS — Z1231 Encounter for screening mammogram for malignant neoplasm of breast: Secondary | ICD-10-CM

## 2018-03-14 DIAGNOSIS — S92591A Other fracture of right lesser toe(s), initial encounter for closed fracture: Secondary | ICD-10-CM | POA: Diagnosis not present

## 2018-03-14 DIAGNOSIS — S90121A Contusion of right lesser toe(s) without damage to nail, initial encounter: Secondary | ICD-10-CM | POA: Diagnosis not present

## 2018-04-04 ENCOUNTER — Emergency Department (HOSPITAL_COMMUNITY)
Admission: EM | Admit: 2018-04-04 | Discharge: 2018-04-04 | Disposition: A | Discharge status: Home / Self Care | Payer: Medicare Other | Attending: Emergency Medicine | Admitting: Emergency Medicine

## 2018-04-04 ENCOUNTER — Other Ambulatory Visit: Payer: Self-pay

## 2018-04-04 ENCOUNTER — Emergency Department (HOSPITAL_COMMUNITY): Payer: Medicare Other

## 2018-04-04 ENCOUNTER — Encounter (HOSPITAL_COMMUNITY): Payer: Self-pay | Admitting: Emergency Medicine

## 2018-04-04 DIAGNOSIS — R1084 Generalized abdominal pain: Secondary | ICD-10-CM | POA: Insufficient documentation

## 2018-04-04 DIAGNOSIS — K529 Noninfective gastroenteritis and colitis, unspecified: Secondary | ICD-10-CM | POA: Insufficient documentation

## 2018-04-04 DIAGNOSIS — K921 Melena: Secondary | ICD-10-CM | POA: Diagnosis present

## 2018-04-04 DIAGNOSIS — K5730 Diverticulosis of large intestine without perforation or abscess without bleeding: Secondary | ICD-10-CM | POA: Diagnosis not present

## 2018-04-04 DIAGNOSIS — R109 Unspecified abdominal pain: Secondary | ICD-10-CM | POA: Diagnosis not present

## 2018-04-04 DIAGNOSIS — Z79899 Other long term (current) drug therapy: Secondary | ICD-10-CM | POA: Insufficient documentation

## 2018-04-04 LAB — COMPREHENSIVE METABOLIC PANEL
ALT: 26 U/L (ref 14–54)
ANION GAP: 13 (ref 5–15)
AST: 23 U/L (ref 15–41)
Albumin: 4.3 g/dL (ref 3.5–5.0)
Alkaline Phosphatase: 79 U/L (ref 38–126)
BUN: 22 mg/dL — ABNORMAL HIGH (ref 6–20)
CO2: 24 mmol/L (ref 22–32)
CREATININE: 0.81 mg/dL (ref 0.44–1.00)
Calcium: 9.3 mg/dL (ref 8.9–10.3)
Chloride: 102 mmol/L (ref 101–111)
GFR calc Af Amer: >60 mL/min (ref >60)
Glucose, Bld: 137 mg/dL — ABNORMAL HIGH (ref 65–99)
Potassium: 3.7 mmol/L (ref 3.5–5.1)
SODIUM: 139 mmol/L (ref 135–145)
TOTAL PROTEIN: 7.1 g/dL (ref 6.5–8.1)
Total Bilirubin: 0.3 mg/dL (ref 0.3–1.2)

## 2018-04-04 LAB — CBC
HCT: 43.8 % (ref 36.0–46.0)
HEMATOCRIT: 43.2 % (ref 36.0–46.0)
Hemoglobin: 14.5 g/dL (ref 12.0–15.0)
Hemoglobin: 14.1 g/dL (ref 12.0–15.0)
MCH: 30.6 pg (ref 26.0–34.0)
MCH: 30.1 pg (ref 26.0–34.0)
MCHC: 33.1 g/dL (ref 30.0–36.0)
MCHC: 32.6 g/dL (ref 30.0–36.0)
MCV: 92.4 fL (ref 78.0–100.0)
MCV: 92.1 fL (ref 78.0–100.0)
PLATELETS: 372 10*3/uL (ref 150–400)
Platelets: 367 10*3/uL (ref 150–400)
RBC: 4.74 MIL/uL (ref 3.87–5.11)
RBC: 4.69 MIL/uL (ref 3.87–5.11)
RDW: 12.4 % (ref 11.5–15.5)
RDW: 12.4 % (ref 11.5–15.5)
WBC: 14.6 10*3/uL — ABNORMAL HIGH (ref 4.0–10.5)
WBC: 14.6 10*3/uL — ABNORMAL HIGH (ref 4.0–10.5)

## 2018-04-04 LAB — POC OCCULT BLOOD, ED: Fecal Occult Bld: NEGATIVE

## 2018-04-04 LAB — TYPE AND SCREEN
ABO/RH(D): O NEG
ANTIBODY SCREEN: NEGATIVE

## 2018-04-04 LAB — ABO/RH: ABO/RH(D): O NEG

## 2018-04-04 MED ORDER — IOPAMIDOL (ISOVUE-300) INJECTION 61%
30.0000 mL | Freq: Once | INTRAVENOUS | Status: DC | PRN
  Administered 2018-04-04: 30 mL via ORAL
  Filled 2018-04-04: qty 30

## 2018-04-04 MED ORDER — DICYCLOMINE HCL 10 MG PO CAPS
10.0000 mg | ORAL_CAPSULE | Freq: Once | ORAL | Status: AC
  Administered 2018-04-04: 10 mg via ORAL
  Filled 2018-04-04: qty 1

## 2018-04-04 MED ORDER — IOPAMIDOL (ISOVUE-300) INJECTION 61%
INTRAVENOUS | Status: AC
  Administered 2018-04-04: 30 mL via ORAL
  Filled 2018-04-04: qty 30

## 2018-04-04 MED ORDER — IOPAMIDOL (ISOVUE-300) INJECTION 61%
INTRAVENOUS | Status: AC
  Filled 2018-04-04: qty 100

## 2018-04-04 MED ORDER — IOPAMIDOL (ISOVUE-300) INJECTION 61%
100.0000 mL | Freq: Once | INTRAVENOUS | Status: AC | PRN
  Administered 2018-04-04: 100 mL via INTRAVENOUS

## 2018-04-04 MED ORDER — ALOSETRON HCL 1 MG PO TABS: 1.0000 mg | ORAL_TABLET | Freq: Two times a day (BID) | ORAL | 0 refills | Status: DC

## 2018-04-04 MED ORDER — SODIUM CHLORIDE 0.9 % IV BOLUS
1000.0000 mL | Freq: Once | INTRAVENOUS | Status: AC
  Administered 2018-04-04: 1000 mL via INTRAVENOUS

## 2018-04-04 MED ORDER — ALOSETRON HCL 1 MG PO TABS: 1.0000 mg | ORAL_TABLET | Freq: Two times a day (BID) | ORAL | 0 refills | Status: AC

## 2018-04-04 NOTE — ED Provider Notes (Addendum)
[place] DEPT Provider Note   CSN: [id number] Arrival date & time: 04/04/18  0601     History   Chief Complaint No chief complaint on file.   HPI Beth Wilcox is a 72 y.o. female.  HPI Patient is a 72 year old female presents to the emergency department with complaints of blood mixed with her stool which began in the middle the night last night.  She reports some crampy abdominal pain.  She thinks she may have had a low-grade fever this morning as she had some sweats.  She had chills through the night.  She is never had GI bleeding before.  She reports on her colonoscopy before in the past she did have diverticulum's.  She does not remember when her last colonoscopy was.  No recent change in her diet.  No recent weight changes.  She reports several episodes of stool with blood mixed on the outside.  No lightheadedness or weakness.   History reviewed. No pertinent past medical history.  There are no active problems to display for this patient.   History reviewed. No pertinent surgical history.   OB History   None      Home Medications    Prior to Admission medications   Medication Sig Start Date End Date Taking? Authorizing Provider  acetaminophen (TYLENOL) 500 MG tablet Take 500 mg by mouth every 6 (six) hours as needed for moderate pain.   Yes [provider]  Ascorbic Acid (VITAMIN C PO) Take 1 tablet by mouth daily.   Yes [provider]  CALCIUM PO Take 1 tablet by mouth daily.   Yes [provider]  fluticasone (FLONASE) 50 MCG/ACT nasal spray Place 1 spray into both nostrils daily.   Yes [provider]  ibuprofen (ADVIL,MOTRIN) 200 MG tablet Take 200-400 mg by mouth every 6 (six) hours as needed for moderate pain.   Yes [provider]  naproxen sodium (ALEVE) 220 MG tablet Take 220 mg by mouth daily as needed (PAIN).   Yes [provider]  TRETINOIN PO Apply 1 application  topically every other day. APPLY TO FACE BUMPS EVERY OTHER DAY   Yes [provider]  alosetron (LOTRONEX) 1 MG tablet Take 1 tablet (1 mg total) by mouth 2 (two) times daily. 04/04/18   Jola Schmidt, MD    Family History No family history on file.  Social History Social History   Tobacco Use  . Smoking status: Never Smoker  . Smokeless tobacco: Never Used  Substance Use Topics  . Alcohol use: Never    Frequency: Never  . Drug use: Never     Allergies   Patient has no known allergies.   Review of Systems Review of Systems  All other systems reviewed and are negative.    Physical Exam Updated Vital Signs BP (!) 141/62 (BP Location: Left Arm)   Pulse 74   Temp 97.6 F (36.4 C) (Oral)   Resp 18   SpO2 100%   Physical Exam  Constitutional: She is oriented to person, place, and time. She appears well-developed and well-nourished. No distress.  HENT:  Head: Normocephalic and atraumatic.  Eyes: EOM are normal.  Neck: Normal range of motion.  Cardiovascular: Normal rate, regular rhythm and normal heart sounds.  Pulmonary/Chest: Effort normal and breath sounds normal.  Abdominal: Soft. She exhibits no distension.  Mild generalized tenderness without guarding or rebound.  Genitourinary:  Genitourinary Comments: No gross blood on rectal examination  Musculoskeletal: Normal range  of motion.  Neurological: She is alert and oriented to person, place, and time.  Skin: Skin is warm and dry.  Psychiatric: She has a normal mood and affect. Judgment normal.  Nursing note and vitals reviewed.    ED Treatments / Results  Labs (all labs ordered are listed, but only abnormal results are displayed) Labs Reviewed  COMPREHENSIVE METABOLIC PANEL - Abnormal; Notable for the following components:      Result Value   Glucose, Bld 137 (*)    BUN 22 (*)    All other components within normal limits  CBC - Abnormal; Notable for the following components:   WBC 14.6 (*)     All other components within normal limits  CBC - Abnormal; Notable for the following components:   WBC 14.6 (*)    All other components within normal limits  POC OCCULT BLOOD, ED  TYPE AND SCREEN  ABO/RH    EKG None  Radiology Ct Abdomen Pelvis W Contrast  Result Date: 04/04/2018 CLINICAL DATA:  Abdominal pain and constipation. Some diarrhea as well. Bloody stool. EXAM: CT ABDOMEN AND PELVIS WITH CONTRAST TECHNIQUE: Multidetector CT imaging of the abdomen and pelvis was performed using the standard protocol following bolus administration of intravenous contrast. CONTRAST:  161mL ISOVUE-300 IOPAMIDOL (ISOVUE-300) INJECTION 61% COMPARISON:  None. FINDINGS: Lower chest: Moderate size hiatal hernia. Mild scarring at the lung bases. Abundant pericardial fat. Hepatobiliary: Normal Pancreas: Normal Spleen: Normal Adrenals/Urinary Tract: Adrenal glands are normal. Kidneys are normal. No cyst, mass, stone or hydronephrosis. No stone in the bladder. Stomach/Bowel: Moderate amount of stool in the right colon and proximal transverse colon. Colitis pattern at the splenic flexure and descending colon as far as the descending sigmoid junction. Etiology is nonspecific, but ischemic colitis, infectious colitis and inflammatory bowel disease are the differential diagnosis. There is diverticulosis throughout the sigmoid region but no sign of active diverticulitis. Vascular/Lymphatic: Aortic atherosclerosis. No aneurysm. Flow seems to be present in the celiac, superior mesenteric and inferior mesenteric arteries. IVC is normal. No retroperitoneal adenopathy. Reproductive: Small leiomyomas of the uterus with variable calcification. No adnexal mass. Other: No free fluid or air. Musculoskeletal: Chronic lower lumbar degenerative changes. Old compression deformity at T12. IMPRESSION: Colitis pattern beginning at the hepatic flexure and extending to the descending sigmoid junction. No evidence of perforation or abscess.  Differential diagnosis is ischemic colitis versus infectious colitis versus inflammatory bowel disease. Moderate amount of stool in the right colon and proximal transverse colon. Small bowel pattern is normal. Diverticulosis in the sigmoid region but no sign of active diverticulitis. Aortic atherosclerosis. No aneurysm. Flow appears to be present in the main aortic branches. Electronically Signed   By: Nelson Chimes M.D.   On: 04/04/2018 10:24    Procedures Procedures (including critical care time)  Medications Ordered in ED Medications  iopamidol (ISOVUE-300) 61 % injection 30 mL (30 mLs Oral Contrast Given 04/04/18 0833)  iopamidol (ISOVUE-300) 61 % injection (has no administration in time range)  sodium chloride 0.9 % bolus 1,000 mL (0 mLs Intravenous Stopped 04/04/18 1052)  dicyclomine (BENTYL) capsule 10 mg (10 mg Oral Given 04/04/18 0957)  iopamidol (ISOVUE-300) 61 % injection 100 mL (100 mLs Intravenous Contrast Given 04/04/18 1006)     Initial Impression / Assessment and Plan / ED Course  I have reviewed the triage vital signs and the nursing notes.  Pertinent labs & imaging results that were available during my care of the patient were reviewed by me and considered in my  medical decision making (see chart for details).     Patient did have a bowel movement while in the emergency department.  This was stool mixed with a small amount of blood on the outside.  This was not grossly bloody.  CT scan confirms likely descending colonic colitis.  Patient will need follow-up with GI.  Her vital signs remained stable.  Her hemoglobin after IV fluids is relatively stable.  She has not had a significant drop.  She is not symptomatic.  Discharged home in good condition.  Strict return precautions given.  Outpatient GI follow-up will be beneficial as she may require repeat colonoscopy.  Patient understands return to the ER for new or worsening symptoms   Hemoglobin stable at 14.   CT scan personally  reviewed and demonstrates inflammatory changes of the descending colon consistent with acute colitis.  Final Clinical Impressions(s) / ED Diagnoses   Final diagnoses:  Acute colitis    ED Discharge Orders        Ordered    alosetron (LOTRONEX) 1 MG tablet  2 times daily     04/04/18 1140       Jola Schmidt, MD 04/04/18 1144    Jola Schmidt, MD 04/04/18 1146

## 2018-04-04 NOTE — ED Triage Notes (Signed)
Pt from home with c/o constipation that began on Saturday. Pt states this transitioned to diarrhea. Pt states she has since had multiple (4) episodes of bloody stool with large clots. Pt states she feels cold and clammy.

## 2018-04-04 NOTE — ED Notes (Signed)
PT ambulate to and from restroom with no assist

## 2018-04-11 DIAGNOSIS — K529 Noninfective gastroenteritis and colitis, unspecified: Secondary | ICD-10-CM | POA: Diagnosis not present

## 2018-04-12 DIAGNOSIS — K529 Noninfective gastroenteritis and colitis, unspecified: Secondary | ICD-10-CM | POA: Diagnosis not present

## 2018-04-20 DIAGNOSIS — E7800 Pure hypercholesterolemia, unspecified: Secondary | ICD-10-CM | POA: Diagnosis not present

## 2018-04-20 DIAGNOSIS — Z1389 Encounter for screening for other disorder: Secondary | ICD-10-CM | POA: Diagnosis not present

## 2018-04-20 DIAGNOSIS — K529 Noninfective gastroenteritis and colitis, unspecified: Secondary | ICD-10-CM | POA: Diagnosis not present

## 2018-04-20 DIAGNOSIS — Z0000 Encounter for general adult medical examination without abnormal findings: Secondary | ICD-10-CM | POA: Diagnosis not present

## 2018-05-09 DIAGNOSIS — K5730 Diverticulosis of large intestine without perforation or abscess without bleeding: Secondary | ICD-10-CM | POA: Diagnosis not present

## 2018-05-09 DIAGNOSIS — R933 Abnormal findings on diagnostic imaging of other parts of digestive tract: Secondary | ICD-10-CM | POA: Diagnosis not present

## 2018-05-09 DIAGNOSIS — D126 Benign neoplasm of colon, unspecified: Secondary | ICD-10-CM | POA: Diagnosis not present

## 2018-05-12 DIAGNOSIS — D126 Benign neoplasm of colon, unspecified: Secondary | ICD-10-CM | POA: Diagnosis not present

## 2018-07-17 ENCOUNTER — Ambulatory Visit
Admission: RE | Admit: 2018-07-17 | Discharge: 2018-07-17 | Disposition: A | Discharge status: Home / Self Care | Payer: Medicare Other | Source: Ambulatory Visit | Attending: Internal Medicine | Admitting: Internal Medicine

## 2018-07-17 ENCOUNTER — Other Ambulatory Visit: Payer: Self-pay | Admitting: Internal Medicine

## 2018-07-17 DIAGNOSIS — M25562 Pain in left knee: Secondary | ICD-10-CM | POA: Diagnosis not present

## 2018-08-03 DIAGNOSIS — M25562 Pain in left knee: Secondary | ICD-10-CM | POA: Diagnosis not present

## 2018-08-10 DIAGNOSIS — H52203 Unspecified astigmatism, bilateral: Secondary | ICD-10-CM | POA: Diagnosis not present

## 2018-08-10 DIAGNOSIS — H538 Other visual disturbances: Secondary | ICD-10-CM | POA: Diagnosis not present

## 2018-08-10 DIAGNOSIS — H524 Presbyopia: Secondary | ICD-10-CM | POA: Diagnosis not present

## 2018-08-10 DIAGNOSIS — G43109 Migraine with aura, not intractable, without status migrainosus: Secondary | ICD-10-CM | POA: Diagnosis not present

## 2018-08-10 DIAGNOSIS — Z961 Presence of intraocular lens: Secondary | ICD-10-CM | POA: Diagnosis not present

## 2018-08-16 DIAGNOSIS — H903 Sensorineural hearing loss, bilateral: Secondary | ICD-10-CM | POA: Diagnosis not present

## 2018-08-22 DIAGNOSIS — Z23 Encounter for immunization: Secondary | ICD-10-CM | POA: Diagnosis not present

## 2018-09-01 DIAGNOSIS — M25562 Pain in left knee: Secondary | ICD-10-CM | POA: Diagnosis not present

## 2018-09-08 DIAGNOSIS — M25562 Pain in left knee: Secondary | ICD-10-CM | POA: Diagnosis not present

## 2018-09-11 DIAGNOSIS — M25562 Pain in left knee: Secondary | ICD-10-CM | POA: Diagnosis not present

## 2018-09-14 DIAGNOSIS — M1712 Unilateral primary osteoarthritis, left knee: Secondary | ICD-10-CM | POA: Diagnosis not present

## 2018-09-15 DIAGNOSIS — M1712 Unilateral primary osteoarthritis, left knee: Secondary | ICD-10-CM | POA: Diagnosis not present

## 2018-09-19 DIAGNOSIS — M1712 Unilateral primary osteoarthritis, left knee: Secondary | ICD-10-CM | POA: Diagnosis not present

## 2018-09-22 DIAGNOSIS — M1712 Unilateral primary osteoarthritis, left knee: Secondary | ICD-10-CM | POA: Diagnosis not present

## 2018-09-27 DIAGNOSIS — M1712 Unilateral primary osteoarthritis, left knee: Secondary | ICD-10-CM | POA: Diagnosis not present

## 2018-09-29 DIAGNOSIS — M1712 Unilateral primary osteoarthritis, left knee: Secondary | ICD-10-CM | POA: Diagnosis not present

## 2018-10-03 DIAGNOSIS — M1712 Unilateral primary osteoarthritis, left knee: Secondary | ICD-10-CM | POA: Diagnosis not present

## 2018-10-04 DIAGNOSIS — L821 Other seborrheic keratosis: Secondary | ICD-10-CM | POA: Diagnosis not present

## 2018-10-04 DIAGNOSIS — R233 Spontaneous ecchymoses: Secondary | ICD-10-CM | POA: Diagnosis not present

## 2018-10-04 DIAGNOSIS — L814 Other melanin hyperpigmentation: Secondary | ICD-10-CM | POA: Diagnosis not present

## 2018-10-04 DIAGNOSIS — L438 Other lichen planus: Secondary | ICD-10-CM | POA: Diagnosis not present

## 2018-10-04 DIAGNOSIS — L720 Epidermal cyst: Secondary | ICD-10-CM | POA: Diagnosis not present

## 2018-10-04 DIAGNOSIS — D1801 Hemangioma of skin and subcutaneous tissue: Secondary | ICD-10-CM | POA: Diagnosis not present

## 2018-10-06 DIAGNOSIS — M1712 Unilateral primary osteoarthritis, left knee: Secondary | ICD-10-CM | POA: Diagnosis not present

## 2018-10-10 DIAGNOSIS — M1712 Unilateral primary osteoarthritis, left knee: Secondary | ICD-10-CM | POA: Diagnosis not present

## 2018-10-11 DIAGNOSIS — M545 Low back pain: Secondary | ICD-10-CM | POA: Diagnosis not present

## 2018-10-11 DIAGNOSIS — M79672 Pain in left foot: Secondary | ICD-10-CM | POA: Diagnosis not present

## 2018-10-13 DIAGNOSIS — M1712 Unilateral primary osteoarthritis, left knee: Secondary | ICD-10-CM | POA: Diagnosis not present

## 2018-10-30 DIAGNOSIS — M5414 Radiculopathy, thoracic region: Secondary | ICD-10-CM | POA: Diagnosis not present

## 2018-11-06 DIAGNOSIS — M5414 Radiculopathy, thoracic region: Secondary | ICD-10-CM | POA: Diagnosis not present

## 2018-11-08 DIAGNOSIS — M5414 Radiculopathy, thoracic region: Secondary | ICD-10-CM | POA: Diagnosis not present

## 2018-11-14 DIAGNOSIS — M5414 Radiculopathy, thoracic region: Secondary | ICD-10-CM | POA: Diagnosis not present

## 2018-11-21 DIAGNOSIS — M5414 Radiculopathy, thoracic region: Secondary | ICD-10-CM | POA: Diagnosis not present

## 2018-11-28 DIAGNOSIS — M5414 Radiculopathy, thoracic region: Secondary | ICD-10-CM | POA: Diagnosis not present

## 2018-12-01 DIAGNOSIS — M5414 Radiculopathy, thoracic region: Secondary | ICD-10-CM | POA: Diagnosis not present

## 2018-12-06 DIAGNOSIS — M5414 Radiculopathy, thoracic region: Secondary | ICD-10-CM | POA: Diagnosis not present

## 2018-12-08 DIAGNOSIS — M5414 Radiculopathy, thoracic region: Secondary | ICD-10-CM | POA: Diagnosis not present

## 2018-12-19 DIAGNOSIS — M5414 Radiculopathy, thoracic region: Secondary | ICD-10-CM | POA: Diagnosis not present

## 2019-02-19 DIAGNOSIS — Z124 Encounter for screening for malignant neoplasm of cervix: Secondary | ICD-10-CM | POA: Diagnosis not present

## 2019-02-22 ENCOUNTER — Other Ambulatory Visit: Payer: Self-pay | Admitting: Obstetrics

## 2019-02-22 DIAGNOSIS — E2839 Other primary ovarian failure: Secondary | ICD-10-CM

## 2019-03-06 ENCOUNTER — Other Ambulatory Visit: Payer: Medicare Other

## 2019-04-19 DIAGNOSIS — N762 Acute vulvitis: Secondary | ICD-10-CM | POA: Diagnosis not present

## 2019-04-24 DIAGNOSIS — R739 Hyperglycemia, unspecified: Secondary | ICD-10-CM | POA: Diagnosis not present

## 2019-04-24 DIAGNOSIS — E7800 Pure hypercholesterolemia, unspecified: Secondary | ICD-10-CM | POA: Diagnosis not present

## 2019-04-24 DIAGNOSIS — M8580 Other specified disorders of bone density and structure, unspecified site: Secondary | ICD-10-CM | POA: Diagnosis not present

## 2019-04-24 DIAGNOSIS — H9193 Unspecified hearing loss, bilateral: Secondary | ICD-10-CM | POA: Diagnosis not present

## 2019-04-24 DIAGNOSIS — Z1389 Encounter for screening for other disorder: Secondary | ICD-10-CM | POA: Diagnosis not present

## 2019-04-24 DIAGNOSIS — Z0000 Encounter for general adult medical examination without abnormal findings: Secondary | ICD-10-CM | POA: Diagnosis not present

## 2019-04-26 DIAGNOSIS — E7800 Pure hypercholesterolemia, unspecified: Secondary | ICD-10-CM | POA: Diagnosis not present

## 2019-04-26 DIAGNOSIS — R739 Hyperglycemia, unspecified: Secondary | ICD-10-CM | POA: Diagnosis not present

## 2019-05-04 ENCOUNTER — Other Ambulatory Visit: Payer: Medicare Other

## 2019-07-16 ENCOUNTER — Other Ambulatory Visit: Payer: Self-pay

## 2019-07-16 ENCOUNTER — Other Ambulatory Visit: Payer: Medicare Other

## 2019-07-16 ENCOUNTER — Ambulatory Visit
Admission: RE | Admit: 2019-07-16 | Discharge: 2019-07-16 | Disposition: A | Discharge status: Home / Self Care | Payer: Medicare Other | Source: Ambulatory Visit | Attending: Obstetrics | Admitting: Obstetrics

## 2019-07-16 DIAGNOSIS — M8589 Other specified disorders of bone density and structure, multiple sites: Secondary | ICD-10-CM | POA: Diagnosis not present

## 2019-07-16 DIAGNOSIS — Z780 Asymptomatic menopausal state: Secondary | ICD-10-CM | POA: Diagnosis not present

## 2019-07-16 DIAGNOSIS — E2839 Other primary ovarian failure: Secondary | ICD-10-CM

## 2019-07-19 DIAGNOSIS — N898 Other specified noninflammatory disorders of vagina: Secondary | ICD-10-CM | POA: Diagnosis not present

## 2019-07-19 DIAGNOSIS — N762 Acute vulvitis: Secondary | ICD-10-CM | POA: Diagnosis not present

## 2019-07-19 DIAGNOSIS — M8580 Other specified disorders of bone density and structure, unspecified site: Secondary | ICD-10-CM | POA: Diagnosis not present

## 2019-08-21 DIAGNOSIS — H524 Presbyopia: Secondary | ICD-10-CM | POA: Diagnosis not present

## 2019-08-21 DIAGNOSIS — H52203 Unspecified astigmatism, bilateral: Secondary | ICD-10-CM | POA: Diagnosis not present

## 2019-08-21 DIAGNOSIS — Z961 Presence of intraocular lens: Secondary | ICD-10-CM | POA: Diagnosis not present

## 2019-08-21 DIAGNOSIS — Z0100 Encounter for examination of eyes and vision without abnormal findings: Secondary | ICD-10-CM | POA: Diagnosis not present

## 2019-08-22 DIAGNOSIS — Z23 Encounter for immunization: Secondary | ICD-10-CM | POA: Diagnosis not present

## 2019-09-20 DIAGNOSIS — R319 Hematuria, unspecified: Secondary | ICD-10-CM | POA: Diagnosis not present

## 2019-11-21 DIAGNOSIS — L570 Actinic keratosis: Secondary | ICD-10-CM | POA: Diagnosis not present

## 2019-11-21 DIAGNOSIS — L909 Atrophic disorder of skin, unspecified: Secondary | ICD-10-CM | POA: Diagnosis not present

## 2019-11-21 DIAGNOSIS — L821 Other seborrheic keratosis: Secondary | ICD-10-CM | POA: Diagnosis not present

## 2019-11-21 DIAGNOSIS — L814 Other melanin hyperpigmentation: Secondary | ICD-10-CM | POA: Diagnosis not present

## 2019-11-21 DIAGNOSIS — L819 Disorder of pigmentation, unspecified: Secondary | ICD-10-CM | POA: Diagnosis not present

## 2019-11-21 DIAGNOSIS — D1801 Hemangioma of skin and subcutaneous tissue: Secondary | ICD-10-CM | POA: Diagnosis not present

## 2020-01-11 IMAGING — CT CT ABD-PELV W/ CM
2 of 5 series · 16 of 46 positions shown, 18 images · IV contrast (ISOVUE)
Comparison: None.

CLINICAL DATA: Abdominal pain and constipation. Some diarrhea as
well. Bloody stool.

EXAM:
CT ABDOMEN AND PELVIS WITH CONTRAST
TECHNIQUE: Multidetector CT imaging of the abdomen and pelvis was performed
using the standard protocol following bolus administration of
intravenous contrast.
CONTRAST:  100mL KL2B2X-0QQ IOPAMIDOL (KL2B2X-[id number]) INJECTION 61%

[Series 2: axial st · axial · 0.72mm/px · z∈[-620,-284]mm · 13 of 79 slices shown, 15 images]
[im 6/79  soft-tissue]
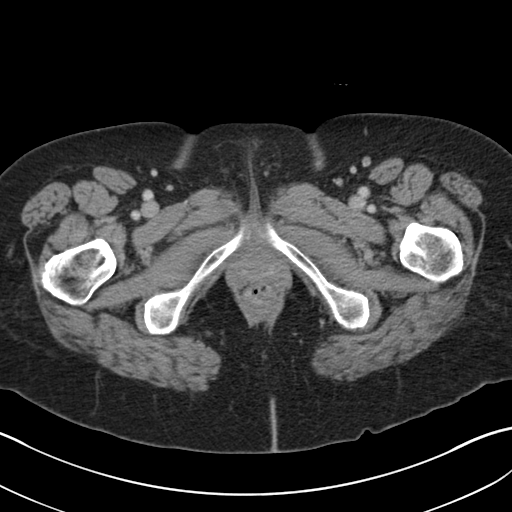
[im 6/79  bone]
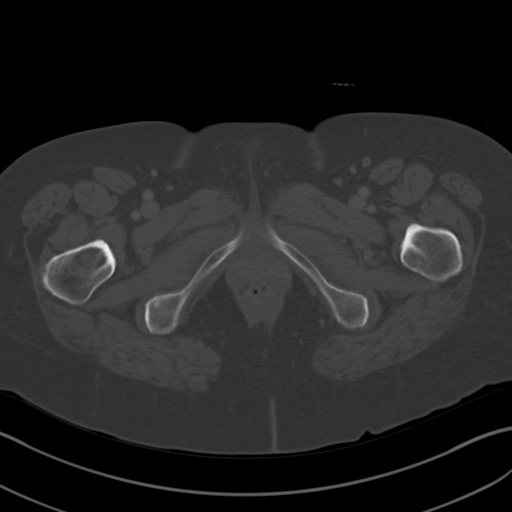
[im 12/79  soft-tissue]
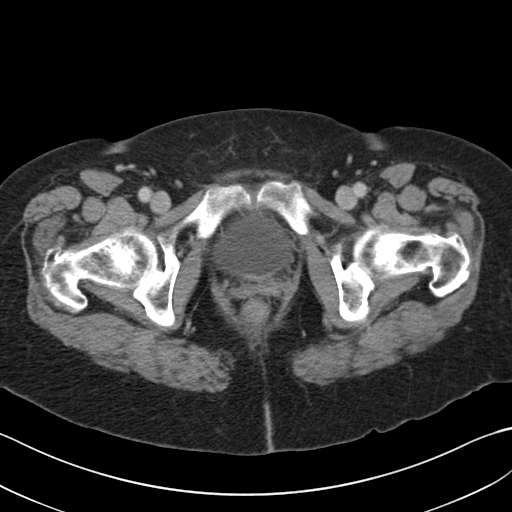
[im 17/79  soft-tissue]
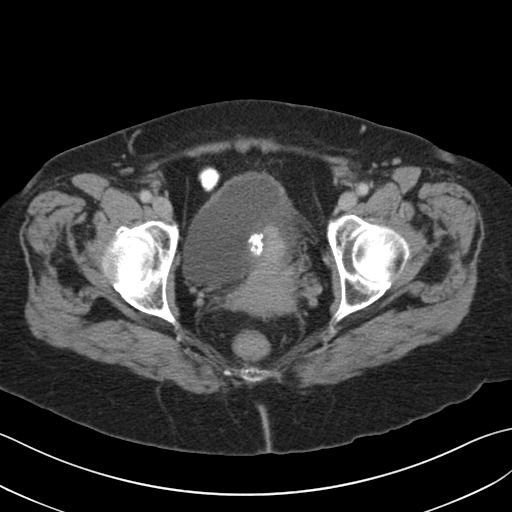
[im 23/79  soft-tissue]
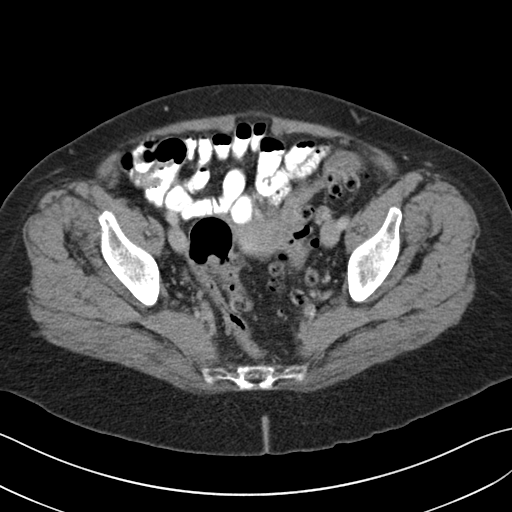
[im 28/79  soft-tissue]
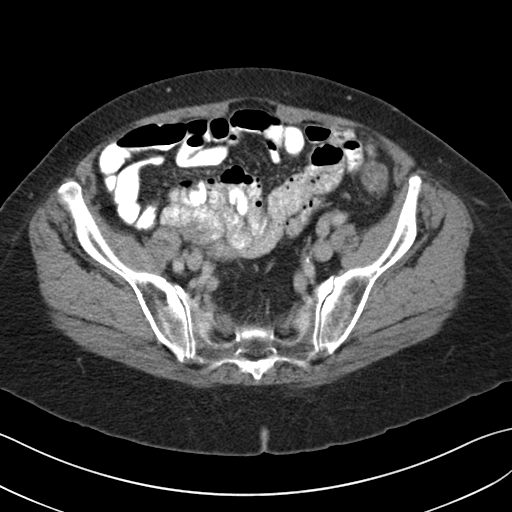
[im 34/79  soft-tissue]
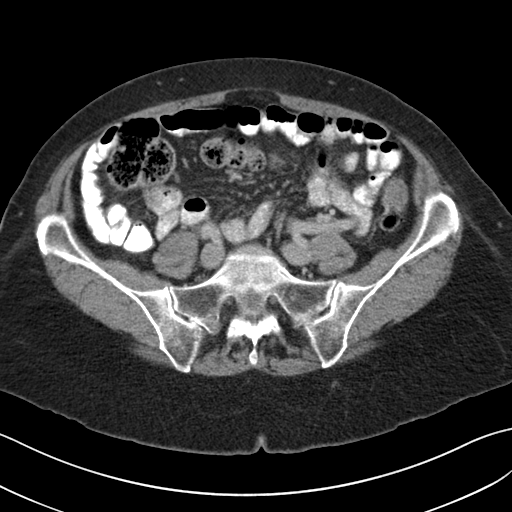
[im 40/79  soft-tissue]
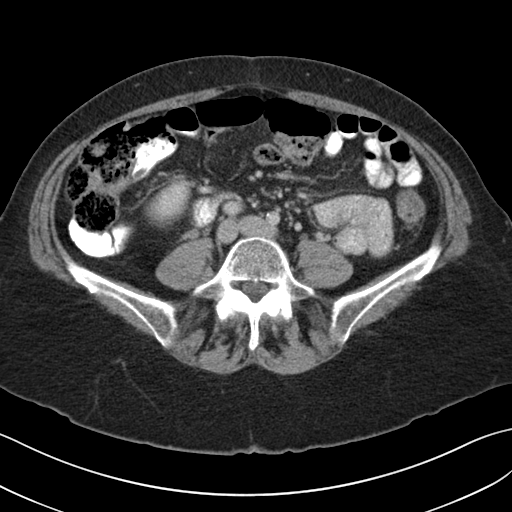
[im 45/79  soft-tissue]
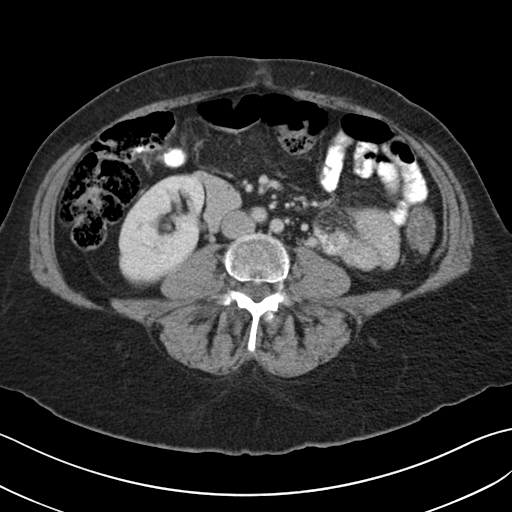
[im 51/79  soft-tissue]
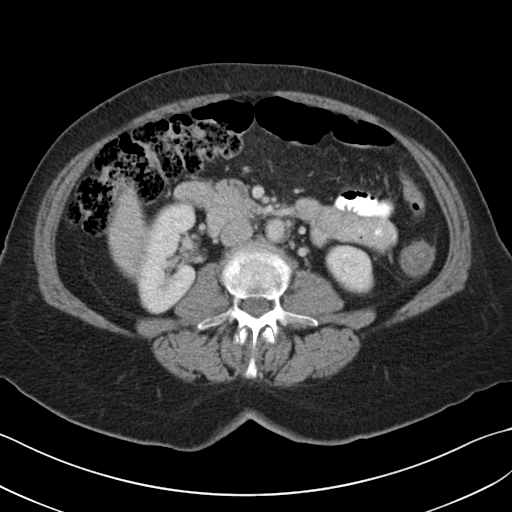
[im 51/79  bone]
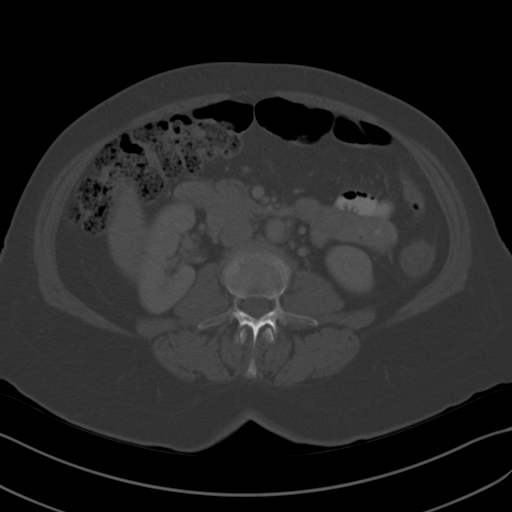
[im 56/79  soft-tissue]
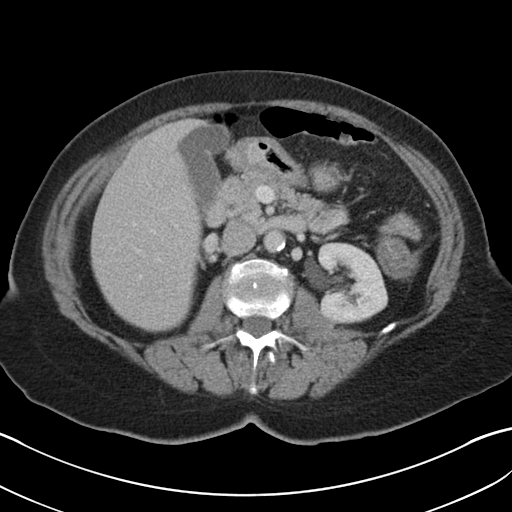
[im 62/79  soft-tissue]
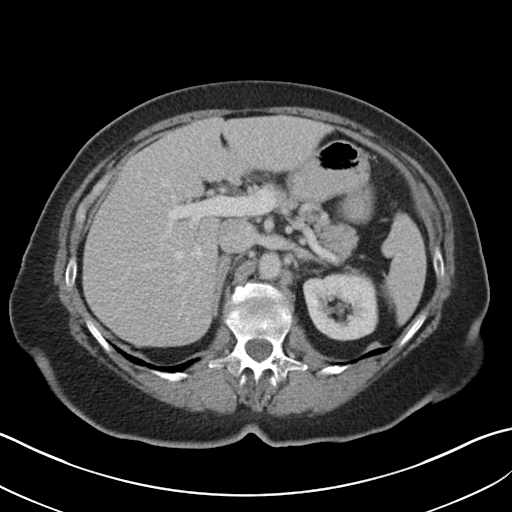
[im 67/79  soft-tissue]
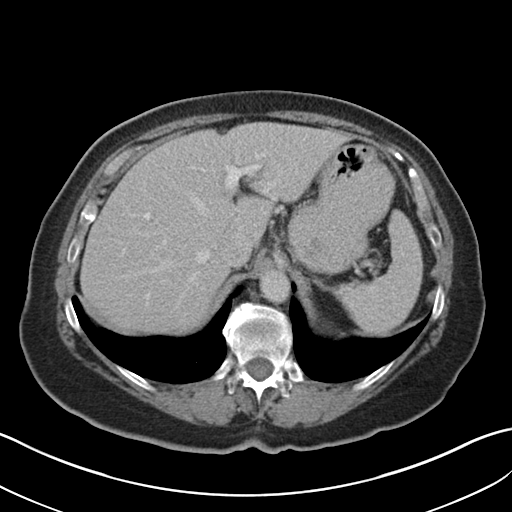
[im 73/79  soft-tissue]
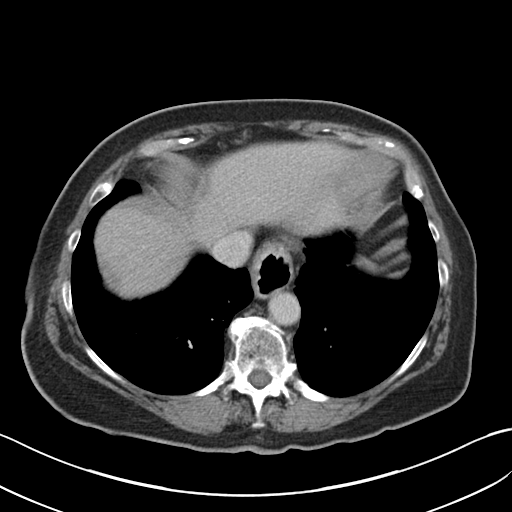

[Series 4: coronal st · coronal · 0.81mm/px · 3 of 80 slices shown]
[im 27/80  soft-tissue]
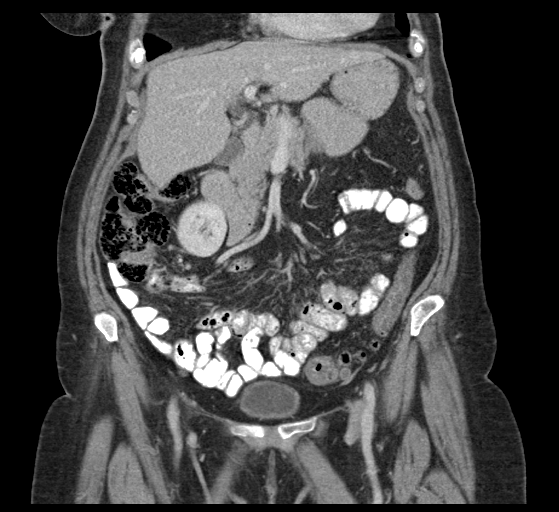
[im 36/80  soft-tissue]
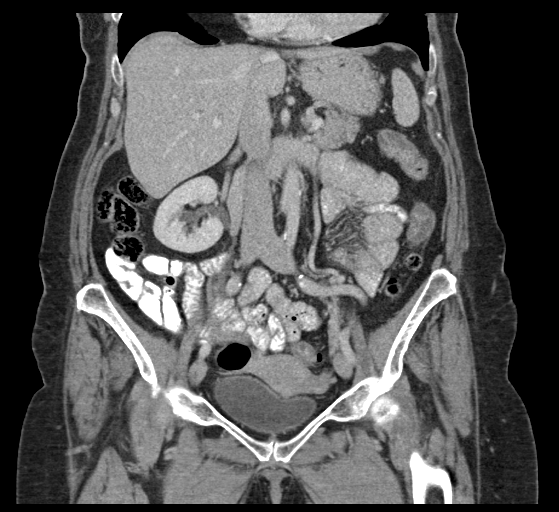
[im 44/80  soft-tissue]
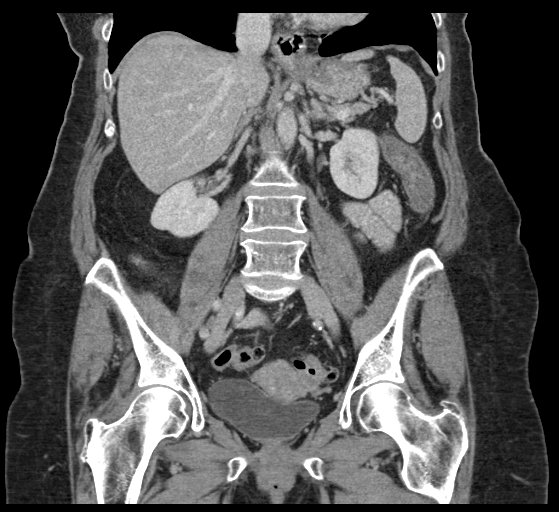

[16 of 46 positions shown; findings below may reference images not displayed]

FINDINGS: Lower chest: Moderate size hiatal hernia. Mild scarring at the lung
bases. Abundant pericardial fat.

Hepatobiliary: Normal

Pancreas: Normal

Spleen: Normal

Adrenals/Urinary Tract: Adrenal glands are normal. Kidneys are
normal. No cyst, mass, stone or hydronephrosis. No stone in the
bladder.

Stomach/Bowel: Moderate amount of stool in the right colon and
proximal transverse colon. Colitis pattern at the splenic flexure
and descending colon as far as the descending sigmoid junction.
Etiology is nonspecific, but ischemic colitis, infectious colitis
and inflammatory bowel disease are the differential diagnosis. There
is diverticulosis throughout the sigmoid region but no sign of
active diverticulitis.

Vascular/Lymphatic: Aortic atherosclerosis. No aneurysm. Flow seems
to be present in the celiac, superior mesenteric and inferior
mesenteric arteries. IVC is normal. No retroperitoneal adenopathy.

Reproductive: Small leiomyomas of the uterus with variable
calcification. No adnexal mass.

Other: No free fluid or air.

Musculoskeletal: Chronic lower lumbar degenerative changes. Old
compression deformity at T12.
IMPRESSION: Colitis pattern beginning at the hepatic flexure and extending to
the descending sigmoid junction. No evidence of perforation or
abscess. Differential diagnosis is ischemic colitis versus
infectious colitis versus inflammatory bowel disease.

Moderate amount of stool in the right colon and proximal transverse
colon. Small bowel pattern is normal.

Diverticulosis in the sigmoid region but no sign of active
diverticulitis.

Aortic atherosclerosis. No aneurysm. Flow appears to be present in
the main aortic branches.

## 2020-01-28 ENCOUNTER — Ambulatory Visit: Payer: Medicare Other

## 2020-04-07 DIAGNOSIS — M810 Age-related osteoporosis without current pathological fracture: Secondary | ICD-10-CM | POA: Diagnosis not present

## 2020-04-07 DIAGNOSIS — Z124 Encounter for screening for malignant neoplasm of cervix: Secondary | ICD-10-CM | POA: Diagnosis not present

## 2020-04-07 DIAGNOSIS — N952 Postmenopausal atrophic vaginitis: Secondary | ICD-10-CM | POA: Diagnosis not present

## 2020-04-07 DIAGNOSIS — N762 Acute vulvitis: Secondary | ICD-10-CM | POA: Diagnosis not present

## 2020-04-25 ENCOUNTER — Other Ambulatory Visit: Payer: Self-pay | Admitting: Internal Medicine

## 2020-04-25 DIAGNOSIS — Z1389 Encounter for screening for other disorder: Secondary | ICD-10-CM | POA: Diagnosis not present

## 2020-04-25 DIAGNOSIS — E7800 Pure hypercholesterolemia, unspecified: Secondary | ICD-10-CM | POA: Diagnosis not present

## 2020-04-25 DIAGNOSIS — Z1159 Encounter for screening for other viral diseases: Secondary | ICD-10-CM | POA: Diagnosis not present

## 2020-04-25 DIAGNOSIS — M8580 Other specified disorders of bone density and structure, unspecified site: Secondary | ICD-10-CM | POA: Diagnosis not present

## 2020-04-25 DIAGNOSIS — Z0000 Encounter for general adult medical examination without abnormal findings: Secondary | ICD-10-CM | POA: Diagnosis not present

## 2020-04-25 DIAGNOSIS — Z23 Encounter for immunization: Secondary | ICD-10-CM | POA: Diagnosis not present

## 2020-05-05 DIAGNOSIS — R300 Dysuria: Secondary | ICD-10-CM | POA: Diagnosis not present

## 2020-05-13 ENCOUNTER — Other Ambulatory Visit: Payer: Medicare Other

## 2020-05-14 ENCOUNTER — Ambulatory Visit
Admission: RE | Admit: 2020-05-14 | Discharge: 2020-05-14 | Disposition: A | Discharge status: Home / Self Care | Payer: Self-pay | Source: Ambulatory Visit | Attending: Internal Medicine | Admitting: Internal Medicine

## 2020-05-14 DIAGNOSIS — E7800 Pure hypercholesterolemia, unspecified: Secondary | ICD-10-CM

## 2020-08-11 DIAGNOSIS — M25562 Pain in left knee: Secondary | ICD-10-CM | POA: Diagnosis not present

## 2020-08-11 DIAGNOSIS — M25552 Pain in left hip: Secondary | ICD-10-CM | POA: Diagnosis not present

## 2020-08-19 DIAGNOSIS — M7632 Iliotibial band syndrome, left leg: Secondary | ICD-10-CM | POA: Diagnosis not present

## 2020-08-19 DIAGNOSIS — S336XXD Sprain of sacroiliac joint, subsequent encounter: Secondary | ICD-10-CM | POA: Diagnosis not present

## 2020-08-20 DIAGNOSIS — M7632 Iliotibial band syndrome, left leg: Secondary | ICD-10-CM | POA: Diagnosis not present

## 2020-08-20 DIAGNOSIS — S336XXD Sprain of sacroiliac joint, subsequent encounter: Secondary | ICD-10-CM | POA: Diagnosis not present

## 2020-08-21 DIAGNOSIS — H52203 Unspecified astigmatism, bilateral: Secondary | ICD-10-CM | POA: Diagnosis not present

## 2020-08-21 DIAGNOSIS — Z23 Encounter for immunization: Secondary | ICD-10-CM | POA: Diagnosis not present

## 2020-08-21 DIAGNOSIS — H524 Presbyopia: Secondary | ICD-10-CM | POA: Diagnosis not present

## 2020-08-21 DIAGNOSIS — Z961 Presence of intraocular lens: Secondary | ICD-10-CM | POA: Diagnosis not present

## 2020-08-27 DIAGNOSIS — M7632 Iliotibial band syndrome, left leg: Secondary | ICD-10-CM | POA: Diagnosis not present

## 2020-08-27 DIAGNOSIS — S336XXD Sprain of sacroiliac joint, subsequent encounter: Secondary | ICD-10-CM | POA: Diagnosis not present

## 2020-09-01 DIAGNOSIS — S336XXD Sprain of sacroiliac joint, subsequent encounter: Secondary | ICD-10-CM | POA: Diagnosis not present

## 2020-09-01 DIAGNOSIS — M7632 Iliotibial band syndrome, left leg: Secondary | ICD-10-CM | POA: Diagnosis not present

## 2020-09-05 DIAGNOSIS — S336XXD Sprain of sacroiliac joint, subsequent encounter: Secondary | ICD-10-CM | POA: Diagnosis not present

## 2020-09-05 DIAGNOSIS — M7632 Iliotibial band syndrome, left leg: Secondary | ICD-10-CM | POA: Diagnosis not present

## 2020-09-11 DIAGNOSIS — M7632 Iliotibial band syndrome, left leg: Secondary | ICD-10-CM | POA: Diagnosis not present

## 2020-09-11 DIAGNOSIS — S336XXD Sprain of sacroiliac joint, subsequent encounter: Secondary | ICD-10-CM | POA: Diagnosis not present

## 2020-09-15 DIAGNOSIS — M25552 Pain in left hip: Secondary | ICD-10-CM | POA: Diagnosis not present

## 2020-09-15 DIAGNOSIS — S336XXD Sprain of sacroiliac joint, subsequent encounter: Secondary | ICD-10-CM | POA: Diagnosis not present

## 2020-09-15 DIAGNOSIS — M7632 Iliotibial band syndrome, left leg: Secondary | ICD-10-CM | POA: Diagnosis not present

## 2020-09-17 DIAGNOSIS — M7632 Iliotibial band syndrome, left leg: Secondary | ICD-10-CM | POA: Diagnosis not present

## 2020-09-17 DIAGNOSIS — S336XXD Sprain of sacroiliac joint, subsequent encounter: Secondary | ICD-10-CM | POA: Diagnosis not present

## 2020-09-22 DIAGNOSIS — M7632 Iliotibial band syndrome, left leg: Secondary | ICD-10-CM | POA: Diagnosis not present

## 2020-09-22 DIAGNOSIS — S336XXD Sprain of sacroiliac joint, subsequent encounter: Secondary | ICD-10-CM | POA: Diagnosis not present

## 2020-11-08 DIAGNOSIS — Z20822 Contact with and (suspected) exposure to covid-19: Secondary | ICD-10-CM | POA: Diagnosis not present

## 2021-02-05 DIAGNOSIS — D171 Benign lipomatous neoplasm of skin and subcutaneous tissue of trunk: Secondary | ICD-10-CM | POA: Diagnosis not present

## 2021-02-05 DIAGNOSIS — L814 Other melanin hyperpigmentation: Secondary | ICD-10-CM | POA: Diagnosis not present

## 2021-02-05 DIAGNOSIS — D1801 Hemangioma of skin and subcutaneous tissue: Secondary | ICD-10-CM | POA: Diagnosis not present

## 2021-02-05 DIAGNOSIS — L603 Nail dystrophy: Secondary | ICD-10-CM | POA: Diagnosis not present

## 2021-02-05 DIAGNOSIS — L570 Actinic keratosis: Secondary | ICD-10-CM | POA: Diagnosis not present

## 2021-02-05 DIAGNOSIS — L821 Other seborrheic keratosis: Secondary | ICD-10-CM | POA: Diagnosis not present

## 2021-02-19 DIAGNOSIS — H61892 Other specified disorders of left external ear: Secondary | ICD-10-CM | POA: Diagnosis not present

## 2021-03-26 DIAGNOSIS — Z23 Encounter for immunization: Secondary | ICD-10-CM | POA: Diagnosis not present

## 2021-05-01 DIAGNOSIS — Z0000 Encounter for general adult medical examination without abnormal findings: Secondary | ICD-10-CM | POA: Diagnosis not present

## 2021-05-01 DIAGNOSIS — E7800 Pure hypercholesterolemia, unspecified: Secondary | ICD-10-CM | POA: Diagnosis not present

## 2021-05-01 DIAGNOSIS — Z1389 Encounter for screening for other disorder: Secondary | ICD-10-CM | POA: Diagnosis not present

## 2021-05-01 DIAGNOSIS — M8580 Other specified disorders of bone density and structure, unspecified site: Secondary | ICD-10-CM | POA: Diagnosis not present

## 2021-05-01 DIAGNOSIS — M549 Dorsalgia, unspecified: Secondary | ICD-10-CM | POA: Diagnosis not present

## 2021-05-07 DIAGNOSIS — L900 Lichen sclerosus et atrophicus: Secondary | ICD-10-CM | POA: Diagnosis not present

## 2021-05-07 DIAGNOSIS — Z124 Encounter for screening for malignant neoplasm of cervix: Secondary | ICD-10-CM | POA: Diagnosis not present

## 2021-05-07 DIAGNOSIS — Z6829 Body mass index (BMI) 29.0-29.9, adult: Secondary | ICD-10-CM | POA: Diagnosis not present

## 2021-05-07 DIAGNOSIS — N952 Postmenopausal atrophic vaginitis: Secondary | ICD-10-CM | POA: Diagnosis not present

## 2021-05-07 DIAGNOSIS — M8580 Other specified disorders of bone density and structure, unspecified site: Secondary | ICD-10-CM | POA: Diagnosis not present

## 2021-05-07 DIAGNOSIS — Z01411 Encounter for gynecological examination (general) (routine) with abnormal findings: Secondary | ICD-10-CM | POA: Diagnosis not present

## 2021-05-07 DIAGNOSIS — N762 Acute vulvitis: Secondary | ICD-10-CM | POA: Diagnosis not present

## 2021-05-07 DIAGNOSIS — Z01419 Encounter for gynecological examination (general) (routine) without abnormal findings: Secondary | ICD-10-CM | POA: Diagnosis not present

## 2021-05-20 ENCOUNTER — Other Ambulatory Visit: Payer: Self-pay | Admitting: Obstetrics

## 2021-05-20 DIAGNOSIS — M8580 Other specified disorders of bone density and structure, unspecified site: Secondary | ICD-10-CM

## 2021-07-01 DIAGNOSIS — Z20822 Contact with and (suspected) exposure to covid-19: Secondary | ICD-10-CM | POA: Diagnosis not present

## 2021-07-21 DIAGNOSIS — T161XXA Foreign body in right ear, initial encounter: Secondary | ICD-10-CM | POA: Diagnosis not present

## 2021-08-25 DIAGNOSIS — H04123 Dry eye syndrome of bilateral lacrimal glands: Secondary | ICD-10-CM | POA: Diagnosis not present

## 2021-08-25 DIAGNOSIS — Z961 Presence of intraocular lens: Secondary | ICD-10-CM | POA: Diagnosis not present

## 2021-08-31 DIAGNOSIS — Z23 Encounter for immunization: Secondary | ICD-10-CM | POA: Diagnosis not present

## 2021-09-07 DIAGNOSIS — Z23 Encounter for immunization: Secondary | ICD-10-CM | POA: Diagnosis not present

## 2021-11-25 ENCOUNTER — Ambulatory Visit
Admission: RE | Admit: 2021-11-25 | Discharge: 2021-11-25 | Disposition: A | Discharge status: Home / Self Care | Payer: Medicare Other | Source: Ambulatory Visit | Attending: Obstetrics | Admitting: Obstetrics

## 2021-11-25 DIAGNOSIS — M8589 Other specified disorders of bone density and structure, multiple sites: Secondary | ICD-10-CM | POA: Diagnosis not present

## 2021-11-25 DIAGNOSIS — M8580 Other specified disorders of bone density and structure, unspecified site: Secondary | ICD-10-CM

## 2021-12-23 DIAGNOSIS — D692 Other nonthrombocytopenic purpura: Secondary | ICD-10-CM | POA: Diagnosis not present

## 2021-12-23 DIAGNOSIS — L300 Nummular dermatitis: Secondary | ICD-10-CM | POA: Diagnosis not present

## 2021-12-23 DIAGNOSIS — L720 Epidermal cyst: Secondary | ICD-10-CM | POA: Diagnosis not present

## 2021-12-23 DIAGNOSIS — L814 Other melanin hyperpigmentation: Secondary | ICD-10-CM | POA: Diagnosis not present

## 2021-12-23 DIAGNOSIS — D1801 Hemangioma of skin and subcutaneous tissue: Secondary | ICD-10-CM | POA: Diagnosis not present

## 2021-12-23 DIAGNOSIS — L821 Other seborrheic keratosis: Secondary | ICD-10-CM | POA: Diagnosis not present

## 2022-02-04 DIAGNOSIS — L900 Lichen sclerosus et atrophicus: Secondary | ICD-10-CM | POA: Diagnosis not present

## 2022-02-04 DIAGNOSIS — M8580 Other specified disorders of bone density and structure, unspecified site: Secondary | ICD-10-CM | POA: Diagnosis not present

## 2022-02-04 DIAGNOSIS — N952 Postmenopausal atrophic vaginitis: Secondary | ICD-10-CM | POA: Diagnosis not present

## 2022-03-02 DIAGNOSIS — M79601 Pain in right arm: Secondary | ICD-10-CM | POA: Diagnosis not present

## 2022-03-11 ENCOUNTER — Ambulatory Visit
Admission: RE | Admit: 2022-03-11 | Discharge: 2022-03-11 | Disposition: A | Discharge status: Home / Self Care | Payer: Medicare Other | Source: Ambulatory Visit | Attending: Internal Medicine | Admitting: Internal Medicine

## 2022-03-11 ENCOUNTER — Other Ambulatory Visit: Payer: Self-pay | Admitting: Internal Medicine

## 2022-03-11 DIAGNOSIS — M79601 Pain in right arm: Secondary | ICD-10-CM

## 2022-03-11 DIAGNOSIS — M85821 Other specified disorders of bone density and structure, right upper arm: Secondary | ICD-10-CM | POA: Diagnosis not present

## 2022-03-11 DIAGNOSIS — M79621 Pain in right upper arm: Secondary | ICD-10-CM | POA: Diagnosis not present

## 2022-04-29 ENCOUNTER — Other Ambulatory Visit: Payer: Self-pay | Admitting: Physician Assistant

## 2022-04-29 ENCOUNTER — Ambulatory Visit
Admission: RE | Admit: 2022-04-29 | Discharge: 2022-04-29 | Disposition: A | Discharge status: Home / Self Care | Payer: Medicare Other | Source: Ambulatory Visit | Attending: Physician Assistant | Admitting: Physician Assistant

## 2022-04-29 DIAGNOSIS — R051 Acute cough: Secondary | ICD-10-CM

## 2022-04-29 DIAGNOSIS — R059 Cough, unspecified: Secondary | ICD-10-CM | POA: Diagnosis not present

## 2022-04-29 DIAGNOSIS — S22080A Wedge compression fracture of T11-T12 vertebra, initial encounter for closed fracture: Secondary | ICD-10-CM | POA: Diagnosis not present

## 2022-05-07 ENCOUNTER — Ambulatory Visit (HOSPITAL_BASED_OUTPATIENT_CLINIC_OR_DEPARTMENT_OTHER): Payer: Medicare Other | Admitting: Physical Therapy

## 2022-05-07 ENCOUNTER — Encounter (HOSPITAL_BASED_OUTPATIENT_CLINIC_OR_DEPARTMENT_OTHER): Payer: Self-pay

## 2022-05-13 DIAGNOSIS — R5383 Other fatigue: Secondary | ICD-10-CM | POA: Diagnosis not present

## 2022-05-13 DIAGNOSIS — Z0000 Encounter for general adult medical examination without abnormal findings: Secondary | ICD-10-CM | POA: Diagnosis not present

## 2022-05-13 DIAGNOSIS — E7800 Pure hypercholesterolemia, unspecified: Secondary | ICD-10-CM | POA: Diagnosis not present

## 2022-05-13 DIAGNOSIS — M8580 Other specified disorders of bone density and structure, unspecified site: Secondary | ICD-10-CM | POA: Diagnosis not present

## 2022-05-13 DIAGNOSIS — Z8616 Personal history of COVID-19: Secondary | ICD-10-CM | POA: Diagnosis not present

## 2022-05-14 DIAGNOSIS — L900 Lichen sclerosus et atrophicus: Secondary | ICD-10-CM | POA: Diagnosis not present

## 2022-05-14 DIAGNOSIS — M8580 Other specified disorders of bone density and structure, unspecified site: Secondary | ICD-10-CM | POA: Diagnosis not present

## 2022-05-14 DIAGNOSIS — Z01419 Encounter for gynecological examination (general) (routine) without abnormal findings: Secondary | ICD-10-CM | POA: Diagnosis not present

## 2022-05-14 DIAGNOSIS — N952 Postmenopausal atrophic vaginitis: Secondary | ICD-10-CM | POA: Diagnosis not present

## 2022-05-14 DIAGNOSIS — Z124 Encounter for screening for malignant neoplasm of cervix: Secondary | ICD-10-CM | POA: Diagnosis not present

## 2022-05-14 DIAGNOSIS — Z6829 Body mass index (BMI) 29.0-29.9, adult: Secondary | ICD-10-CM | POA: Diagnosis not present

## 2022-05-14 DIAGNOSIS — Z0142 Encounter for cervical smear to confirm findings of recent normal smear following initial abnormal smear: Secondary | ICD-10-CM | POA: Diagnosis not present

## 2022-05-14 DIAGNOSIS — Z01411 Encounter for gynecological examination (general) (routine) with abnormal findings: Secondary | ICD-10-CM | POA: Diagnosis not present

## 2022-05-14 DIAGNOSIS — M810 Age-related osteoporosis without current pathological fracture: Secondary | ICD-10-CM | POA: Diagnosis not present

## 2022-06-24 ENCOUNTER — Ambulatory Visit
Admission: EM | Admit: 2022-06-24 | Discharge: 2022-06-24 | Disposition: A | Discharge status: Home / Self Care | Payer: Medicare Other | Attending: Emergency Medicine | Admitting: Emergency Medicine

## 2022-06-24 ENCOUNTER — Ambulatory Visit (INDEPENDENT_AMBULATORY_CARE_PROVIDER_SITE_OTHER): Payer: Medicare Other

## 2022-06-24 DIAGNOSIS — M25532 Pain in left wrist: Secondary | ICD-10-CM | POA: Diagnosis not present

## 2022-06-24 DIAGNOSIS — Z23 Encounter for immunization: Secondary | ICD-10-CM | POA: Diagnosis not present

## 2022-06-24 DIAGNOSIS — W010XXA Fall on same level from slipping, tripping and stumbling without subsequent striking against object, initial encounter: Secondary | ICD-10-CM

## 2022-06-24 DIAGNOSIS — S80211A Abrasion, right knee, initial encounter: Secondary | ICD-10-CM | POA: Diagnosis not present

## 2022-06-24 DIAGNOSIS — M158 Other polyosteoarthritis: Secondary | ICD-10-CM | POA: Diagnosis not present

## 2022-06-24 DIAGNOSIS — W19XXXA Unspecified fall, initial encounter: Secondary | ICD-10-CM | POA: Diagnosis not present

## 2022-06-24 DIAGNOSIS — S50311A Abrasion of right elbow, initial encounter: Secondary | ICD-10-CM | POA: Diagnosis not present

## 2022-06-24 DIAGNOSIS — S60512A Abrasion of left hand, initial encounter: Secondary | ICD-10-CM | POA: Diagnosis not present

## 2022-06-24 MED ORDER — DICLOFENAC SODIUM 1 % EX GEL: 4.0000 g | Freq: Four times a day (QID) | CUTANEOUS | 2 refills | Status: AC

## 2022-06-24 MED ORDER — "XEROFORM PETROLAT GAUZE 1""X8"" EX MISC": 1.0000 | Freq: Every day | CUTANEOUS | 0 refills | Status: AC

## 2022-06-24 MED ORDER — ACETAMINOPHEN 500 MG PO TABS: 1000.0000 mg | ORAL_TABLET | Freq: Three times a day (TID) | ORAL | 0 refills | Status: AC | PRN

## 2022-06-24 MED ORDER — MUPIROCIN 2 % EX OINT: TOPICAL_OINTMENT | CUTANEOUS | 0 refills | Status: AC

## 2022-06-24 MED ORDER — TETANUS-DIPHTH-ACELL PERTUSSIS 5-2.5-18.5 LF-MCG/0.5 IM SUSY
0.5000 mL | PREFILLED_SYRINGE | Freq: Once | INTRAMUSCULAR | Status: AC
  Administered 2022-06-24: 0.5 mL via INTRAMUSCULAR

## 2022-06-24 NOTE — ED Provider Notes (Signed)
[facility]    CSN: [id number] Arrival date & time: 06/24/22  7342    HISTORY   Chief Complaint  Patient presents with   Fall   Abrasion   HPI Beth Wilcox is a 76 y.o. female. Patient presents to urgent care after falling while walking in the park with a friend today.  Patient states she her small root in the pathway, fell forward, landed on both her left outstretched hand, her right elbow, right knee and the right side of her chest.  Patient states she did not lose consciousness, did not hit her head.  States her companion is a Marine scientist and advised her to have her wound on her hand evaluated due to a loose flap of skin and a lot of bleeding at the base of her left thumb.  Patient states she also has abrasions on her right knee her left arm and her right elbow.  When asked, patient reports pain with rotating, flexing and extending her left wrist.  The history is provided by the patient.   History reviewed. No pertinent past medical history. There are no problems to display for this patient.  History reviewed. No pertinent surgical history. OB History   No obstetric history on file.    Home Medications    Prior to Admission medications   Medication Sig Start Date End Date Taking? Authorizing Provider  acetaminophen (TYLENOL) 500 MG tablet Take 500 mg by mouth every 6 (six) hours as needed for moderate pain.    [provider]  alosetron (LOTRONEX) 1 MG tablet Take 1 tablet (1 mg total) by mouth 2 (two) times daily. 04/04/18   Jola Schmidt, MD  Ascorbic Acid (VITAMIN C PO) Take 1 tablet by mouth daily.    [provider]  CALCIUM PO Take 1 tablet by mouth daily.    [provider]  fluticasone (FLONASE) 50 MCG/ACT nasal spray Place 1 spray into both nostrils daily.    [provider]  ibuprofen (ADVIL,MOTRIN) 200 MG tablet Take 200-400 mg by mouth every 6 (six) hours as needed for moderate pain.    [provider]  naproxen  sodium (ALEVE) 220 MG tablet Take 220 mg by mouth daily as needed (PAIN).    [provider]  TRETINOIN PO Apply 1 application topically every other day. APPLY TO FACE BUMPS EVERY OTHER DAY    [provider]    Family History History reviewed. No pertinent family history. Social History Social History   Tobacco Use   Smoking status: Never   Smokeless tobacco: Never  Substance Use Topics   Alcohol use: Never   Drug use: Never   Allergies   Alosetron hcl  Review of Systems Review of Systems Pertinent findings noted in history of present illness.   Physical Exam Triage Vital Signs ED Triage Vitals  Enc Vitals Group     BP 10/16/21 0827 (!) 147/82     Pulse Rate 10/16/21 0827 72     Resp 10/16/21 0827 18     Temp 10/16/21 0827 98.3 F (36.8 C)     Temp Source 10/16/21 0827 Oral     SpO2 10/16/21 0827 98 %     Weight --      Height --      Head Circumference --      Peak Flow --      Pain Score 10/16/21 0826 5     Pain Loc --      Pain Edu? --  Excl. in GC? --   No data found.  Updated Vital Signs BP 133/78 (BP Location: Left Arm)   Pulse 64   Temp 98.3 F (36.8 C) (Oral)   Resp 18   SpO2 96%   Physical Exam Vitals and nursing note reviewed.  Constitutional:      General: She is not in acute distress.    Appearance: Normal appearance.  HENT:     Head: Normocephalic and atraumatic.  Eyes:     Pupils: Pupils are equal, round, and reactive to light.  Cardiovascular:     Rate and Rhythm: Normal rate and regular rhythm.  Pulmonary:     Effort: Pulmonary effort is normal.     Breath sounds: Normal breath sounds.  Musculoskeletal:     Right wrist: Deformity present. No swelling, effusion, lacerations, tenderness or bony tenderness. Normal range of motion.     Left wrist: Deformity, tenderness and bony tenderness present. No swelling, effusion or lacerations. Decreased range of motion.     Cervical back: Normal range of motion and neck  supple.  Skin:    General: Skin is warm and dry.     Findings: Laceration (web of left thumb, right knee, right elbow) present.  Neurological:     General: No focal deficit present.     Mental Status: She is alert and oriented to person, place, and time. Mental status is at baseline.  Psychiatric:        Mood and Affect: Mood normal.        Behavior: Behavior normal.        Thought Content: Thought content normal.        Judgment: Judgment normal.     Visual Acuity Right Eye Distance:   Left Eye Distance:   Bilateral Distance:    Right Eye Near:   Left Eye Near:    Bilateral Near:     [facility]ouse / Diagnostics / Procedures:    EKG  Radiology DG Wrist Complete Left  Result Date: 06/24/2022 CLINICAL DATA:  Left wrist pain.  Fell. EXAM: LEFT WRIST - COMPLETE 3+ VIEW COMPARISON:  None Available. FINDINGS: No acute wrist or proximal hand fracture is identified. Severe degenerative changes at the carpometacarpal joint of the thumb and possible remote postsurgical changes. IMPRESSION: No acute wrist or proximal hand fracture. Severe degenerative changes at the [facility] joint of the thumb. Electronically Signed   By: Marijo Sanes M.D.   On: 06/24/2022 10:04    Procedures Laceration Repair  Date/Time: 06/24/2022 10:24 AM  Performed by: Lynden Oxford Scales, PA-C Authorized by: Lynden Oxford Scales, PA-C   Consent:    Consent obtained:  Verbal   Consent given by:  Patient   Risks, benefits, and alternatives were discussed: yes     Risks discussed:  Infection, pain and poor cosmetic result   Alternatives discussed:  No treatment, delayed treatment, observation and referral Universal protocol:    Procedure explained and questions answered to patient or proxy's satisfaction: yes     Patient identity confirmed:  Arm band Anesthesia:    Anesthesia method:  None Laceration details:    Location:  Hand   Hand location:  L palm   Length (cm):  0.8   Depth (mm):  2 Exploration:     Limited defect created (wound extended): yes     Hemostasis achieved with:  Direct pressure   Contaminated: yes   Treatment:    Area cleansed with:  Chlorhexidine   Amount of cleaning:  Standard   Debridement:  Moderate (15 blade used to remove skin flap from laceration to help promote healing, skin flap was too thin for suturing.)   Undermining:  None   Scar revision: no   Post-procedure details:    Dressing: Xeroform gauze covered with sterile 2 x 2 and Coban.   Procedure completion:  Tolerated  (including critical care time)  UC Diagnoses / Final Clinical Impressions(s)   I have reviewed the triage vital signs and the nursing notes.  Pertinent labs & imaging results that were available during my care of the patient were reviewed by me and considered in my medical decision making (see chart for details).    Final diagnoses:  None   Severe degenerative arthritis appreciated on x-ray of left wrist, similar deformity seen in right hand.  Patient advised that following on her wrist will likely cause increased redness for the next several days.  Patient was advised to begin Tylenol and apply diclofenac gel to wrist as needed for pain.  Patient provided with a tetanus booster today given no booster in the last 5 years.  Patient provided with a prescription for mupirocin to apply twice daily as needed.  Return cautions advised.  ED Prescriptions     Medication Sig Dispense Auth. Provider   acetaminophen (TYLENOL) 500 MG tablet Take 2 tablets (1,000 mg total) by mouth every 8 (eight) hours as needed for up to 30 doses for mild pain or fever. 60 tablet Lynden Oxford Scales, PA-C   diclofenac Sodium (VOLTAREN) 1 % GEL Apply 4 g topically 4 (four) times daily. Apply to affected areas 4 times daily as needed for pain. 100 g Lynden Oxford Scales, PA-C   mupirocin ointment (BACTROBAN) 2 % Apply to affected area twice daily for 10 days. 30 g Lynden Oxford Scales, PA-C   Bismuth  Tribromoph-Petrolatum (XEROFORM PETROLAT GAUZE 1"X8") MISC Apply 1 Application topically daily at 6 (six) AM for 7 days. 7 each Lynden Oxford Scales, PA-C      PDMP not reviewed this encounter.  Pending results:  Labs Reviewed - No data to display  Medications Ordered in UC: Medications - No data to display  Disposition Upon Discharge:  Condition: stable for discharge home Home: take medications as prescribed; routine discharge instructions as discussed; follow up as advised.  Patient presented with an acute illness with associated systemic symptoms and significant discomfort requiring urgent management. In my opinion, this is a condition that a prudent lay person (someone who possesses an average knowledge of health and medicine) may potentially expect to result in complications if not addressed urgently such as respiratory distress, impairment of bodily function or dysfunction of bodily organs.   Routine symptom specific, illness specific and/or disease specific instructions were discussed with the patient and/or caregiver at length.   As such, the patient has been evaluated and assessed, work-up was performed and treatment was provided in alignment with urgent care protocols and evidence based medicine.  Patient/parent/caregiver has been advised that the patient may require follow up for further testing and treatment if the symptoms continue in spite of treatment, as clinically indicated and appropriate.  Patient/parent/caregiver has been advised to return to the [facility] or PCP if no better; to PCP or the Emergency Department if new signs and symptoms develop, or if the current signs or symptoms continue to change or worsen for further workup, evaluation and treatment as clinically indicated and appropriate  The patient will follow up with their current PCP if and as  advised. If the patient does not currently have a PCP we will assist them in obtaining one.   The patient may need specialty  follow up if the symptoms continue, in spite of conservative treatment and management, for further workup, evaluation, consultation and treatment as clinically indicated and appropriate.   Patient/parent/caregiver verbalized understanding and agreement of plan as discussed.  All questions were addressed during visit.  Please see discharge instructions below for further details of plan.  Discharge Instructions:   Discharge Instructions      The x-ray of your left wrist did not reveal any acute fracture but you do appear to have severe degenerative arthritis at the base of your left thumb while falling on your hand no doubt has aggravated this pre-existing arthritis.    I provided you with a prescription for Voltaren gel that you can rub on to your hand and wrist several times a day for pain relief.    I do not see documentation of a tetanus vaccine in your chart and therefore recommend that you receive 1 today.  I do not believe that antibiotics are indicated at this point but please continue to monitor your wounds for signs of purulent drainage, red streaking, poor wound healing, increasing tenderness or swelling around the wounds.  If any of these occur, please reach out to [place] to let us know, I will be happy to prescribe antibiotics for you.  You may continue to have joint stiffness and soreness for the next 5-7 days, you are welcome to use the Voltaren gel on any of those stiff joints and also recommend that you continue taking ibuprofen 1000 mg 3 times daily for pain prevention.  Thank you for visiting urgent care today.  I appreciate the opportunity to participate in your care.  You can also consider freezing a Ziploc bag of one half dish detergent and one half rubbing alcohol for a malleable ice pack that you can apply to your hand 4 times a day to reduce inflammation as well.  A Ziploc bag of ice and water will also do as well.  Please keep the wound at the base of your left thumb  covered for the next 24 hours with Xeroform gauze and perform a full dressing change tomorrow.  The day after that, please begin applying mupirocin ointment to the wound and covering it with sufficient bandage.  Is important that you continue to keep your hand dry which is why you be provided you with some gloves to wear when you are performing tasks that may get your hand wet.          This office note has been dictated using Museum/gallery curator.  Unfortunately, and despite my best efforts, this method of dictation can sometimes lead to occasional typographical or grammatical errors.  I apologize in advance if this occurs.     Lynden Oxford Scales, PA-C 06/26/22 [phone]

## 2022-06-24 NOTE — ED Triage Notes (Signed)
Pt states she fell forward while walking in the park.she has abrasions to the rt knee, left palm, and right elbow. The patient denies hitting her head.   Happened: today

## 2022-06-24 NOTE — Discharge Instructions (Addendum)
The x-ray of your left wrist did not reveal any acute fracture but you do appear to have severe degenerative arthritis at the base of your left thumb while falling on your hand no doubt has aggravated this pre-existing arthritis.    I provided you with a prescription for Voltaren gel that you can rub on to your hand and wrist several times a day for pain relief.    I do not see documentation of a tetanus vaccine in your chart and therefore recommend that you receive 1 today.  I do not believe that antibiotics are indicated at this point but please continue to monitor your wounds for signs of purulent drainage, red streaking, poor wound healing, increasing tenderness or swelling around the wounds.  If any of these occur, please reach out to [place] to let us know, I will be happy to prescribe antibiotics for you.  You may continue to have joint stiffness and soreness for the next 5-7 days, you are welcome to use the Voltaren gel on any of those stiff joints and also recommend that you continue taking ibuprofen 1000 mg 3 times daily for pain prevention.  Thank you for visiting urgent care today.  I appreciate the opportunity to participate in your care.  You can also consider freezing a Ziploc bag of one half dish detergent and one half rubbing alcohol for a malleable ice pack that you can apply to your hand 4 times a day to reduce inflammation as well.  A Ziploc bag of ice and water will also do as well.  Please keep the wound at the base of your left thumb covered for the next 24 hours with Xeroform gauze and perform a full dressing change tomorrow.  The day after that, please begin applying mupirocin ointment to the wound and covering it with sufficient bandage.  Is important that you continue to keep your hand dry which is why you be provided you with some gloves to wear when you are performing tasks that may get your hand wet.

## 2022-08-27 DIAGNOSIS — G4719 Other hypersomnia: Secondary | ICD-10-CM | POA: Diagnosis not present

## 2022-08-27 DIAGNOSIS — J3089 Other allergic rhinitis: Secondary | ICD-10-CM | POA: Diagnosis not present

## 2022-09-09 DIAGNOSIS — Z961 Presence of intraocular lens: Secondary | ICD-10-CM | POA: Diagnosis not present

## 2022-09-09 DIAGNOSIS — G43109 Migraine with aura, not intractable, without status migrainosus: Secondary | ICD-10-CM | POA: Diagnosis not present

## 2022-09-14 DIAGNOSIS — G4719 Other hypersomnia: Secondary | ICD-10-CM | POA: Diagnosis not present

## 2022-09-14 DIAGNOSIS — R030 Elevated blood-pressure reading, without diagnosis of hypertension: Secondary | ICD-10-CM | POA: Diagnosis not present

## 2022-09-17 DIAGNOSIS — J302 Other seasonal allergic rhinitis: Secondary | ICD-10-CM | POA: Diagnosis not present

## 2022-09-17 DIAGNOSIS — K219 Gastro-esophageal reflux disease without esophagitis: Secondary | ICD-10-CM | POA: Diagnosis not present

## 2022-09-23 DIAGNOSIS — Z23 Encounter for immunization: Secondary | ICD-10-CM | POA: Diagnosis not present

## 2022-10-06 DIAGNOSIS — G4733 Obstructive sleep apnea (adult) (pediatric): Secondary | ICD-10-CM | POA: Diagnosis not present

## 2022-10-11 DIAGNOSIS — G4733 Obstructive sleep apnea (adult) (pediatric): Secondary | ICD-10-CM | POA: Diagnosis not present

## 2022-10-11 DIAGNOSIS — E663 Overweight: Secondary | ICD-10-CM | POA: Diagnosis not present

## 2022-10-15 DIAGNOSIS — Z23 Encounter for immunization: Secondary | ICD-10-CM | POA: Diagnosis not present

## 2022-12-30 DIAGNOSIS — L821 Other seborrheic keratosis: Secondary | ICD-10-CM | POA: Diagnosis not present

## 2022-12-30 DIAGNOSIS — D692 Other nonthrombocytopenic purpura: Secondary | ICD-10-CM | POA: Diagnosis not present

## 2022-12-30 DIAGNOSIS — D1801 Hemangioma of skin and subcutaneous tissue: Secondary | ICD-10-CM | POA: Diagnosis not present

## 2023-01-11 DIAGNOSIS — G4733 Obstructive sleep apnea (adult) (pediatric): Secondary | ICD-10-CM | POA: Diagnosis not present

## 2023-01-17 DIAGNOSIS — N952 Postmenopausal atrophic vaginitis: Secondary | ICD-10-CM | POA: Diagnosis not present

## 2023-01-17 DIAGNOSIS — L900 Lichen sclerosus et atrophicus: Secondary | ICD-10-CM | POA: Diagnosis not present

## 2023-03-03 DIAGNOSIS — R519 Headache, unspecified: Secondary | ICD-10-CM | POA: Diagnosis not present

## 2023-03-03 DIAGNOSIS — J309 Allergic rhinitis, unspecified: Secondary | ICD-10-CM | POA: Diagnosis not present

## 2023-03-03 DIAGNOSIS — K219 Gastro-esophageal reflux disease without esophagitis: Secondary | ICD-10-CM | POA: Diagnosis not present

## 2023-03-03 DIAGNOSIS — Z86010 Personal history of colonic polyps: Secondary | ICD-10-CM | POA: Diagnosis not present

## 2023-03-31 DIAGNOSIS — K219 Gastro-esophageal reflux disease without esophagitis: Secondary | ICD-10-CM | POA: Diagnosis not present

## 2023-03-31 DIAGNOSIS — J309 Allergic rhinitis, unspecified: Secondary | ICD-10-CM | POA: Diagnosis not present

## 2023-04-26 DIAGNOSIS — G4733 Obstructive sleep apnea (adult) (pediatric): Secondary | ICD-10-CM | POA: Diagnosis not present

## 2023-05-12 DIAGNOSIS — K219 Gastro-esophageal reflux disease without esophagitis: Secondary | ICD-10-CM | POA: Diagnosis not present

## 2023-05-12 DIAGNOSIS — Z86010 Personal history of colonic polyps: Secondary | ICD-10-CM | POA: Diagnosis not present

## 2023-05-18 DIAGNOSIS — K219 Gastro-esophageal reflux disease without esophagitis: Secondary | ICD-10-CM | POA: Diagnosis not present

## 2023-05-18 DIAGNOSIS — J3089 Other allergic rhinitis: Secondary | ICD-10-CM | POA: Diagnosis not present

## 2023-05-18 DIAGNOSIS — E7800 Pure hypercholesterolemia, unspecified: Secondary | ICD-10-CM | POA: Diagnosis not present

## 2023-05-18 DIAGNOSIS — Z0000 Encounter for general adult medical examination without abnormal findings: Secondary | ICD-10-CM | POA: Diagnosis not present

## 2023-05-18 DIAGNOSIS — M8580 Other specified disorders of bone density and structure, unspecified site: Secondary | ICD-10-CM | POA: Diagnosis not present

## 2023-06-29 DIAGNOSIS — Z09 Encounter for follow-up examination after completed treatment for conditions other than malignant neoplasm: Secondary | ICD-10-CM | POA: Diagnosis not present

## 2023-06-29 DIAGNOSIS — K2970 Gastritis, unspecified, without bleeding: Secondary | ICD-10-CM | POA: Diagnosis not present

## 2023-06-29 DIAGNOSIS — K2930 Chronic superficial gastritis without bleeding: Secondary | ICD-10-CM | POA: Diagnosis not present

## 2023-06-29 DIAGNOSIS — K5730 Diverticulosis of large intestine without perforation or abscess without bleeding: Secondary | ICD-10-CM | POA: Diagnosis not present

## 2023-06-29 DIAGNOSIS — D125 Benign neoplasm of sigmoid colon: Secondary | ICD-10-CM | POA: Diagnosis not present

## 2023-06-29 DIAGNOSIS — D128 Benign neoplasm of rectum: Secondary | ICD-10-CM | POA: Diagnosis not present

## 2023-06-29 DIAGNOSIS — Z86010 Personal history of colonic polyps: Secondary | ICD-10-CM | POA: Diagnosis not present

## 2023-06-29 DIAGNOSIS — K449 Diaphragmatic hernia without obstruction or gangrene: Secondary | ICD-10-CM | POA: Diagnosis not present

## 2023-07-01 DIAGNOSIS — K2930 Chronic superficial gastritis without bleeding: Secondary | ICD-10-CM | POA: Diagnosis not present

## 2023-07-01 DIAGNOSIS — D128 Benign neoplasm of rectum: Secondary | ICD-10-CM | POA: Diagnosis not present

## 2023-08-26 DIAGNOSIS — Z23 Encounter for immunization: Secondary | ICD-10-CM | POA: Diagnosis not present

## 2023-09-05 DIAGNOSIS — Z23 Encounter for immunization: Secondary | ICD-10-CM | POA: Diagnosis not present

## 2023-09-15 DIAGNOSIS — H43812 Vitreous degeneration, left eye: Secondary | ICD-10-CM | POA: Diagnosis not present

## 2023-09-15 DIAGNOSIS — H43391 Other vitreous opacities, right eye: Secondary | ICD-10-CM | POA: Diagnosis not present

## 2023-09-15 DIAGNOSIS — H1045 Other chronic allergic conjunctivitis: Secondary | ICD-10-CM | POA: Diagnosis not present

## 2023-09-15 DIAGNOSIS — Z961 Presence of intraocular lens: Secondary | ICD-10-CM | POA: Diagnosis not present

## 2023-09-20 DIAGNOSIS — J018 Other acute sinusitis: Secondary | ICD-10-CM | POA: Diagnosis not present

## 2023-10-12 DIAGNOSIS — K449 Diaphragmatic hernia without obstruction or gangrene: Secondary | ICD-10-CM | POA: Diagnosis not present

## 2023-10-12 DIAGNOSIS — K219 Gastro-esophageal reflux disease without esophagitis: Secondary | ICD-10-CM | POA: Diagnosis not present

## 2023-10-19 ENCOUNTER — Other Ambulatory Visit: Payer: Self-pay | Admitting: Obstetrics

## 2023-10-19 DIAGNOSIS — M810 Age-related osteoporosis without current pathological fracture: Secondary | ICD-10-CM | POA: Diagnosis not present

## 2023-10-19 DIAGNOSIS — N952 Postmenopausal atrophic vaginitis: Secondary | ICD-10-CM | POA: Diagnosis not present

## 2023-10-19 DIAGNOSIS — Z124 Encounter for screening for malignant neoplasm of cervix: Secondary | ICD-10-CM | POA: Diagnosis not present

## 2023-10-19 DIAGNOSIS — L900 Lichen sclerosus et atrophicus: Secondary | ICD-10-CM | POA: Diagnosis not present

## 2023-11-08 DIAGNOSIS — R0982 Postnasal drip: Secondary | ICD-10-CM | POA: Diagnosis not present

## 2023-11-08 DIAGNOSIS — J309 Allergic rhinitis, unspecified: Secondary | ICD-10-CM | POA: Diagnosis not present

## 2023-11-08 DIAGNOSIS — H6992 Unspecified Eustachian tube disorder, left ear: Secondary | ICD-10-CM | POA: Diagnosis not present

## 2023-11-08 DIAGNOSIS — M26623 Arthralgia of bilateral temporomandibular joint: Secondary | ICD-10-CM | POA: Diagnosis not present

## 2023-11-08 DIAGNOSIS — K219 Gastro-esophageal reflux disease without esophagitis: Secondary | ICD-10-CM | POA: Diagnosis not present

## 2023-11-10 DIAGNOSIS — J300 Vasomotor rhinitis: Secondary | ICD-10-CM | POA: Diagnosis not present

## 2023-11-10 DIAGNOSIS — K219 Gastro-esophageal reflux disease without esophagitis: Secondary | ICD-10-CM | POA: Diagnosis not present

## 2023-12-28 DIAGNOSIS — H04123 Dry eye syndrome of bilateral lacrimal glands: Secondary | ICD-10-CM | POA: Diagnosis not present

## 2023-12-28 DIAGNOSIS — H524 Presbyopia: Secondary | ICD-10-CM | POA: Diagnosis not present

## 2023-12-28 DIAGNOSIS — H26493 Other secondary cataract, bilateral: Secondary | ICD-10-CM | POA: Diagnosis not present

## 2023-12-28 DIAGNOSIS — Z961 Presence of intraocular lens: Secondary | ICD-10-CM | POA: Diagnosis not present

## 2024-01-06 DIAGNOSIS — J300 Vasomotor rhinitis: Secondary | ICD-10-CM | POA: Diagnosis not present

## 2024-01-06 DIAGNOSIS — K219 Gastro-esophageal reflux disease without esophagitis: Secondary | ICD-10-CM | POA: Diagnosis not present

## 2024-04-16 DIAGNOSIS — D485 Neoplasm of uncertain behavior of skin: Secondary | ICD-10-CM | POA: Diagnosis not present

## 2024-04-16 DIAGNOSIS — L570 Actinic keratosis: Secondary | ICD-10-CM | POA: Diagnosis not present

## 2024-05-07 DIAGNOSIS — G4733 Obstructive sleep apnea (adult) (pediatric): Secondary | ICD-10-CM | POA: Diagnosis not present

## 2024-05-07 DIAGNOSIS — R030 Elevated blood-pressure reading, without diagnosis of hypertension: Secondary | ICD-10-CM | POA: Diagnosis not present

## 2024-05-21 DIAGNOSIS — J208 Acute bronchitis due to other specified organisms: Secondary | ICD-10-CM | POA: Diagnosis not present

## 2024-05-31 ENCOUNTER — Other Ambulatory Visit: Payer: Medicare Other

## 2024-06-04 DIAGNOSIS — G4733 Obstructive sleep apnea (adult) (pediatric): Secondary | ICD-10-CM | POA: Diagnosis not present

## 2024-06-04 DIAGNOSIS — E7800 Pure hypercholesterolemia, unspecified: Secondary | ICD-10-CM | POA: Diagnosis not present

## 2024-06-04 DIAGNOSIS — M8580 Other specified disorders of bone density and structure, unspecified site: Secondary | ICD-10-CM | POA: Diagnosis not present

## 2024-06-04 DIAGNOSIS — Z23 Encounter for immunization: Secondary | ICD-10-CM | POA: Diagnosis not present

## 2024-06-04 DIAGNOSIS — K219 Gastro-esophageal reflux disease without esophagitis: Secondary | ICD-10-CM | POA: Diagnosis not present

## 2024-06-04 DIAGNOSIS — J302 Other seasonal allergic rhinitis: Secondary | ICD-10-CM | POA: Diagnosis not present

## 2024-06-04 DIAGNOSIS — Z0000 Encounter for general adult medical examination without abnormal findings: Secondary | ICD-10-CM | POA: Diagnosis not present

## 2024-06-04 DIAGNOSIS — E559 Vitamin D deficiency, unspecified: Secondary | ICD-10-CM | POA: Diagnosis not present

## 2024-06-04 DIAGNOSIS — Z79899 Other long term (current) drug therapy: Secondary | ICD-10-CM | POA: Diagnosis not present

## 2024-06-05 ENCOUNTER — Ambulatory Visit (HOSPITAL_BASED_OUTPATIENT_CLINIC_OR_DEPARTMENT_OTHER)
Admission: RE | Admit: 2024-06-05 | Discharge: 2024-06-05 | Disposition: A | Discharge status: Home / Self Care | Source: Ambulatory Visit | Attending: Obstetrics | Admitting: Obstetrics

## 2024-06-05 DIAGNOSIS — M810 Age-related osteoporosis without current pathological fracture: Secondary | ICD-10-CM | POA: Diagnosis not present

## 2024-06-05 DIAGNOSIS — Z780 Asymptomatic menopausal state: Secondary | ICD-10-CM | POA: Diagnosis not present

## 2024-06-05 DIAGNOSIS — M8589 Other specified disorders of bone density and structure, multiple sites: Secondary | ICD-10-CM | POA: Diagnosis not present

## 2024-06-25 DIAGNOSIS — L900 Lichen sclerosus et atrophicus: Secondary | ICD-10-CM | POA: Diagnosis not present

## 2024-06-25 DIAGNOSIS — M810 Age-related osteoporosis without current pathological fracture: Secondary | ICD-10-CM | POA: Diagnosis not present

## 2024-08-13 DIAGNOSIS — H5310 Unspecified subjective visual disturbances: Secondary | ICD-10-CM | POA: Diagnosis not present

## 2024-09-03 DIAGNOSIS — Z23 Encounter for immunization: Secondary | ICD-10-CM | POA: Diagnosis not present

## 2024-09-10 DIAGNOSIS — Z23 Encounter for immunization: Secondary | ICD-10-CM | POA: Diagnosis not present

## 2024-10-02 DIAGNOSIS — K219 Gastro-esophageal reflux disease without esophagitis: Secondary | ICD-10-CM | POA: Diagnosis not present

## 2024-11-05 ENCOUNTER — Other Ambulatory Visit: Payer: Self-pay | Admitting: Medical Genetics

## 2024-11-07 ENCOUNTER — Other Ambulatory Visit (HOSPITAL_COMMUNITY)
Admission: RE | Admit: 2024-11-07 | Discharge: 2024-11-07 | Disposition: A | Discharge status: Home / Self Care | Payer: Self-pay | Source: Ambulatory Visit | Attending: Medical Genetics | Admitting: Medical Genetics

## 2024-11-19 LAB — GENECONNECT MOLECULAR SCREEN: Genetic Analysis Overall Interpretation: NEGATIVE

## 2024-12-03 DIAGNOSIS — K219 Gastro-esophageal reflux disease without esophagitis: Secondary | ICD-10-CM | POA: Diagnosis not present

## 2024-12-03 DIAGNOSIS — J300 Vasomotor rhinitis: Secondary | ICD-10-CM | POA: Diagnosis not present

## 2025-01-11 ENCOUNTER — Emergency Department (HOSPITAL_BASED_OUTPATIENT_CLINIC_OR_DEPARTMENT_OTHER)

## 2025-01-11 ENCOUNTER — Emergency Department (HOSPITAL_BASED_OUTPATIENT_CLINIC_OR_DEPARTMENT_OTHER)
Admission: EM | Admit: 2025-01-11 | Discharge: 2025-01-11 | Disposition: A | Discharge status: Home / Self Care | Attending: Emergency Medicine | Admitting: Emergency Medicine

## 2025-01-11 ENCOUNTER — Other Ambulatory Visit: Payer: Self-pay

## 2025-01-11 DIAGNOSIS — S01112A Laceration without foreign body of left eyelid and periocular area, initial encounter: Secondary | ICD-10-CM | POA: Insufficient documentation

## 2025-01-11 DIAGNOSIS — Y92007 Garden or yard of unspecified non-institutional (private) residence as the place of occurrence of the external cause: Secondary | ICD-10-CM | POA: Diagnosis not present

## 2025-01-11 DIAGNOSIS — J329 Chronic sinusitis, unspecified: Secondary | ICD-10-CM | POA: Diagnosis not present

## 2025-01-11 DIAGNOSIS — S0181XA Laceration without foreign body of other part of head, initial encounter: Secondary | ICD-10-CM | POA: Diagnosis not present

## 2025-01-11 DIAGNOSIS — W01198A Fall on same level from slipping, tripping and stumbling with subsequent striking against other object, initial encounter: Secondary | ICD-10-CM | POA: Diagnosis not present

## 2025-01-11 DIAGNOSIS — W19XXXA Unspecified fall, initial encounter: Secondary | ICD-10-CM

## 2025-01-11 DIAGNOSIS — Y93E5 Activity, floor mopping and cleaning: Secondary | ICD-10-CM | POA: Insufficient documentation

## 2025-01-11 DIAGNOSIS — S0592XA Unspecified injury of left eye and orbit, initial encounter: Secondary | ICD-10-CM | POA: Diagnosis present

## 2025-01-11 MED ORDER — LIDOCAINE-EPINEPHRINE-TETRACAINE (LET) TOPICAL GEL
3.0000 mL | Freq: Once | TOPICAL | Status: AC
  Administered 2025-01-11: 3 mL via TOPICAL
  Filled 2025-01-11: qty 3

## 2025-01-11 NOTE — ED Provider Notes (Signed)
 " [place] [facility] DE[facility]MENT AT [facility] Provider Note   CSN: [id number] Arrival date & time: 01/11/25  1208     Patient presents with: Head Injury   Beth Wilcox is a 79 y.o. female.   Patient with no pertinent past medical history presents today with complaints of mechanical fall.  Reports approximately 40 minutes prior to arrival today she was cleaning up her yard and tripped and fell face first onto concrete.  She did not lose consciousness, is not anticoagulated.  She was able to get up off the ground, however notes a laceration above her left eyebrow that presents with concern for same.  Last Tdap was 2023.  Does report that she thought she might have injured her left hand, however it is no longer hurting her and she is able to fully move it.  Denies any other injuries or complaints.  Denies vision changes, nausea, or vomiting.  The history is provided by the patient. No language interpreter was used.  Head Injury      Prior to Admission medications  Medication Sig Start Date End Date Taking? Authorizing Provider  acetaminophen  (TYLENOL ) 500 MG tablet Take 500 mg by mouth every 6 (six) hours as needed for moderate pain.    [provider]  acetaminophen  (TYLENOL ) 500 MG tablet Take 2 tablets (1,000 mg total) by mouth every 8 (eight) hours as needed for up to 30 doses for mild pain or fever. 06/24/22   Joesph Shaver Scales, PA-C  alosetron  (LOTRONEX ) 1 MG tablet Take 1 tablet (1 mg total) by mouth 2 (two) times daily. 04/04/18   Baxter Drivers, MD  Ascorbic Acid (VITAMIN C PO) Take 1 tablet by mouth daily.    [provider]  CALCIUM PO Take 1 tablet by mouth daily.    [provider]  diclofenac  Sodium (VOLTAREN ) 1 % GEL Apply 4 g topically 4 (four) times daily. Apply to affected areas 4 times daily as needed for pain. 06/24/22   Joesph Shaver Scales, PA-C  fluticasone (FLONASE) 50 MCG/ACT nasal spray Place 1 spray into both nostrils  daily.    [provider]  ibuprofen (ADVIL,MOTRIN) 200 MG tablet Take 200-400 mg by mouth every 6 (six) hours as needed for moderate pain.    [provider]  mupirocin  ointment (BACTROBAN ) 2 % Apply to affected area twice daily for 10 days. 06/24/22   Joesph Shaver Scales, PA-C  naproxen sodium (ALEVE) 220 MG tablet Take 220 mg by mouth daily as needed (PAIN).    [provider]  TRETINOIN PO Apply 1 application topically every other day. APPLY TO FACE BUMPS EVERY OTHER DAY    [provider]    Allergies: Alosetron  hcl    Review of Systems  All other systems reviewed and are negative.   Updated Vital Signs BP 118/88   Pulse 73   Temp 97.8 F (36.6 C) (Oral)   Resp 18   Ht 5' 3 (1.6 m)   Wt 75.3 kg   SpO2 100%   BMI 29.41 kg/m   Physical Exam Vitals and nursing note reviewed.  Constitutional:      General: She is not in acute distress.    Appearance: Normal appearance. She is normal weight. She is not ill-appearing, toxic-appearing or diaphoretic.  HENT:     Head: Normocephalic and atraumatic.     Comments: No racoon eyes No battle sign  3 cm linear laceration noted to the left eyebrow. No active bleeding, crepitus,  or deformity.  Eyes:     Extraocular Movements: Extraocular movements intact.     Pupils: Pupils are equal, round, and reactive to light.     Comments: EOM intact without pain  Cardiovascular:     Rate and Rhythm: Normal rate.     Comments: No tenderness to palpation of the anterior chest wall Pulmonary:     Effort: Pulmonary effort is normal. No respiratory distress.  Abdominal:     Comments: No abdominal tenderness or bruising  Musculoskeletal:        General: Normal range of motion.     Cervical back: Normal and normal range of motion.     Thoracic back: Normal.     Lumbar back: Normal.     Comments: No midline tenderness, no stepoffs or deformity noted on palpation of cervical, thoracic, and lumbar spine   Skin:    General: Skin is warm and dry.  Neurological:     General: No focal deficit present.     Mental Status: She is alert and oriented to person, place, and time.  Psychiatric:        Mood and Affect: Mood normal.        Behavior: Behavior normal.     (all labs ordered are listed, but only abnormal results are displayed) Labs Reviewed - No data to display  EKG: None  Radiology: CT Cervical Spine Wo Contrast Result Date: 01/11/2025 EXAM: CT CERVICAL SPINE WITHOUT CONTRAST 01/11/2025 12:44:53 PM TECHNIQUE: CT of the cervical spine was performed without the administration of intravenous contrast. Multiplanar reformatted images are provided for review. Automated exposure control, iterative reconstruction, and/or weight based adjustment of the mA/kV was utilized to reduce the radiation dose to as low as reasonably achievable. COMPARISON: CT head reported separately today. CLINICAL HISTORY: 79 year old female with neck trauma after falling in yard, forehead injury. FINDINGS: BONES AND ALIGNMENT: No acute fracture or traumatic malalignment. Mild degenerative appearing anterolisthesis of C5 on C6, with chronic facet degeneration at that level greater on the right side. Mild and age indeterminate upper thoracic endplate compression including T1 through T3 (sagittal image 46). No retropulsion or complicating features. DEGENERATIVE CHANGES: Degenerative ankylosis of the C2-C3 facets. Mild degenerative appearing anterolisthesis of C5 on C6, with chronic facet degeneration at that level greater on the right side. Chronic severe disc and endplate degeneration at C6-C7 with developing ankylosis also at that level. Mild and age indeterminate upper thoracic endplate compression including at T1 through T3 (sagittal image 46). But no CT evidence of cervical spinal stenosis. SOFT TISSUES: No prevertebral soft tissue swelling. Negative for age visible non-contrast neck soft tissues. Mild apical lung scarring on  the left. IMPRESSION: 1. No acute traumatic injury identified in the cervical spine. 2. Mild and age-indeterminate upper thoracic vertebral compression T1 thought T3. 3. Cervical spine degeneration with levels of degenerative ankylosis. Electronically signed by: Helayne Hurst MD 01/11/2025 12:59 PM EST RP Workstation: [id number]   CT Head Wo Contrast Result Date: 01/11/2025 EXAM: CT HEAD WITHOUT CONTRAST 01/11/2025 12:44:53 PM TECHNIQUE: CT of the head was performed without the administration of intravenous contrast. Automated exposure control, iterative reconstruction, and/or weight based adjustment of the mA/kV was utilized to reduce the radiation dose to as low as reasonably achievable. COMPARISON: None available. CLINICAL HISTORY: 79 year old female fell in yard with forehead injury. FINDINGS: BRAIN AND VENTRICLES: Normal brain volume for age. Normal gray white differentiation for age. No suspicious intracranial vascular hyperdensity. No acute hemorrhage. No evidence of acute infarct. No hydrocephalus.  No extra-axial collection. No mass effect or midline shift. ORBITS: No superficial orbit soft tissue injury identified. No acute abnormality. SINUSES: Bilateral paranasal sinus opacification appears related to mucoperiosteal thickening, and some hyperdense material within the sinuses. No complicating features. SOFT TISSUES AND SKULL: No scalp soft tissue injury identified. Calcified atherosclerosis at the skull base is mild for age. No acute soft tissue abnormality. No skull fracture. TYMPANIC CAVITIES AND MASTOIDS: Tympanic cavities and mastoids are clear. IMPRESSION: 1. No acute traumatic injury identified. 2. Normal for age non-contrast CT appearance of the brain. 3. Advanced paranasal sinus disease, with some chronic features, and allergic fungal sinusitis is not excluded. Electronically signed by: Helayne Hurst MD 01/11/2025 12:55 PM EST RP Workstation: [id number]     .Laceration Repair  Date/Time:  01/11/2025 2:24 PM  Performed by: Nora Lauraine LABOR, PA-C Authorized by: Nora Lauraine LABOR, PA-C   Consent:    Consent obtained:  Verbal   Consent given by:  Patient   Risks, benefits, and alternatives were discussed: yes     Risks discussed:  Infection, pain, retained foreign body, need for additional repair, poor cosmetic result, tendon damage, nerve damage, poor wound healing and vascular damage   Alternatives discussed:  No treatment, delayed treatment, observation and referral Universal protocol:    Procedure explained and questions answered to patient or proxy's satisfaction: yes     Imaging studies available: yes     Patient identity confirmed:  Verbally with patient Anesthesia:    Anesthesia method:  Topical application   Topical anesthetic:  LET Laceration details:    Location:  Face   Face location:  L eyebrow   Length (cm):  3   Depth (mm):  2 Exploration:    Imaging outcome: foreign body not noted     Wound exploration: wound explored through full range of motion and entire depth of wound visualized   Treatment:    Area cleansed with:  Soap and water   Amount of cleaning:  Standard   Irrigation solution:  Sterile saline   Irrigation volume:  500 ml   Irrigation method:  Pressure wash Skin repair:    Repair method:  Sutures   Suture size:  5-0   Suture material:  Fast-absorbing gut   Suture technique:  Simple interrupted   Number of sutures:  3 Approximation:    Approximation:  Loose Repair type:    Repair type:  Simple Post-procedure details:    Dressing:  Antibiotic ointment and non-adherent dressing   Procedure completion:  Tolerated well, no immediate complications    Medications Ordered in the ED  lidocaine -EPINEPHrine -tetracaine  (LET) topical gel (has no administration in time range)                                    Medical Decision Making Amount and/or Complexity of Data Reviewed Radiology: ordered.   Patient presents today with complaints of  mechanical fall immediately prior to arrival today.  They are afebrile, nontoxic-appearing, and in no acute distress with reassuring vital signs.  Physical exam reveals 3 cm linear laceration noted to the left eyebrow. No active bleeding, crepitus, or deformity. Patient without signs of serious head, neck, or back injury. No midline spinal tenderness or TTP of the chest or abd.  Normal neurological exam. No concern for closed head injury, lung injury, or intraabdominal injury. CT imaging of the head and neck which has resulted and reveals  CT head: 1. No acute traumatic injury identified. 2. Normal for age non-contrast CT appearance of the brain. 3. Advanced paranasal sinus disease, with some chronic features, and allergic fungal sinusitis is not excluded.  CT cervical: 1. No acute traumatic injury identified in the cervical spine. 2. Mild and age-indeterminate upper thoracic vertebral compression T1 thought T3. 3. Cervical spine degeneration with levels of degenerative ankylosis.  I have personally reviewed and interpreted this imaging and agree with radiology interpretation.  Radiology without acute abnormality. Discussed paranasal sinus abnormalities seen on CT with the patient, she does report that she has been helping her husband clean out of moldy building.  She has been wearing a respirator while doing this.  She does have history of allergic sinusitis, has had allergy testing and seen ENT for this previously.  Does report she has had some sinus congestion which is not significantly changed from her baseline.  Will for treatment, recommend PCP follow-up and provide ENT referral for follow-up as well.  Discussed with patient is understanding and in agreement with this.  Patient is able to ambulate without difficulty in the ED.  Pt is hemodynamically stable, in NAD.   Pain has been managed & pt has no complaints prior to dc.  Patient counseled on typical course of muscle stiffness and soreness  post-fall. Discussed s/s that should cause them to return. Patient instructed on NSAID use.    Pressure irrigation performed of patients facial laceration. Wound explored and base of wound visualized in a bloodless field without evidence of foreign body.  Laceration occurred < 8 hours prior to repair which was well tolerated. Tdap up-to-date.  Pt has no comorbidities to effect normal wound healing. Pt discharged  without antibiotics.  Discussed suture home care with patient and answered questions. Pt to follow-up for wound check and suture removal in 7 days; they are to return to the ED sooner for signs of infection. Pt is hemodynamically stable with no complaints prior to dc.   Evaluation and diagnostic testing in the emergency department does not suggest an emergent condition requiring admission or immediate intervention beyond what has been performed at this time.  Plan for discharge with close PCP follow-up.  Patient is understanding and amenable with plan, educated on red flag symptoms that would prompt immediate return.  Patient discharged in stable condition.  Final diagnoses:  Fall, initial encounter  Facial laceration, initial encounter  Sinusitis, unspecified chronicity, unspecified location    ED Discharge Orders     None     An After Visit Summary was printed and given to the patient.      Nachmen Mansel A, PA-C 01/11/25 1441    Patsey Lot, MD 01/12/25 1453  "

## 2025-01-11 NOTE — Discharge Instructions (Signed)
 You sustained a fall today and your imaging is reassuring for acute abnormalities.  You will likely experience muscle spasms, muscle aches, and bruising as a result of these injuries.  Ultimately these injuries will take time to heal.  Rest, hydration, gentle exercise and stretching will aid in recovery from his injuries.    Using medication such as Tylenol  and ibuprofen will help alleviate pain as well as decrease swelling and inflammation associated with these injuries. You may use up to 800 mg ibuprofen every 6 hours or up to 1000 mg of Tylenol  every 6 hours.  You may choose to alternate between the 2.  This would be most effective.  Do not exceed 4000 mg of Tylenol  within 24 hours.  Do not exceed 3200 mg ibuprofen within 24 hours.  If your  fall was today you will likely feel far more achy and painful tomorrow morning.  This is to be expected.  Salt water/Epson salt soaks, massage, icy hot/Biofreeze/BenGay and other similar products can help with symptoms.  Additionally, you were seen in the emergency department for your face laceration.   We have closed your laceration(s) with sutures.  They should dissolve on their own.  You should follow-up with your PCP to have a wound check to ensure it is healing the sutures are dissolving appropriately.  This can be done in 1 to 2 weeks.  Make sure to keep the area as clean and dry as possible. You can let warm soapy water run over the area, but do NOT scrub it.   Watch out for signs of infection, like we discussed, including: increased redness, tenderness, or drainage of pus from the area. If this happens and you have not been prescribed an antibiotic, please seek medical attention for possible infection.   Additionally, incidentally your CT scan did show inflammation in your sinuses.  This could be related to your chronic allergic sinusitis, however I do recommend that you follow-up with ENT to further discuss this.  I have given you a referral with a  number to call to schedule an appointment.  Please do so at your earliest convenience.  Please return to the emergency department for reevaluation if you denies any new or concerning symptoms.

## 2025-01-11 NOTE — ED Triage Notes (Signed)
 Pt caox4 ambulatory reporting approx 40 min ago she tripped over something in her yard causing her to fall. Pt hit L side of forehead  stating she thinks her glasses are what cut her. Also c/o bruising to L pinky finger. Denies any other injury or complaints. Denies LOC. Does not take blood thinner.
